# Patient Record
Sex: Female | Born: 1975 | Hispanic: Yes | Marital: Married | Smoking: Never smoker
Health system: Southern US, Community
[De-identification: ages and names within clinical notes are randomized; demographics above are authoritative.]

## PROBLEM LIST (undated history)

## (undated) DIAGNOSIS — K219 Gastro-esophageal reflux disease without esophagitis: Secondary | ICD-10-CM

## (undated) HISTORY — DX: Gastro-esophageal reflux disease without esophagitis: K21.9

## (undated) HISTORY — PX: OTHER SURGICAL HISTORY: SHX169

---

## 2008-10-08 ENCOUNTER — Emergency Department (HOSPITAL_COMMUNITY): Admission: EM | Admit: 2008-10-08 | Discharge: 2008-10-08 | Payer: Self-pay | Admitting: Emergency Medicine

## 2010-05-18 ENCOUNTER — Ambulatory Visit (HOSPITAL_COMMUNITY)
Admission: RE | Admit: 2010-05-18 | Discharge: 2010-05-18 | Payer: Self-pay | Source: Home / Self Care | Attending: Obstetrics & Gynecology | Admitting: Obstetrics & Gynecology

## 2010-08-14 LAB — RAPID STREP SCREEN (MED CTR MEBANE ONLY): Streptococcus, Group A Screen (Direct): NEGATIVE

## 2010-09-04 ENCOUNTER — Inpatient Hospital Stay (HOSPITAL_COMMUNITY)
Admission: AD | Admit: 2010-09-04 | Discharge: 2010-09-04 | Disposition: A | Discharge status: Home / Self Care | Payer: Medicaid Other | Source: Ambulatory Visit | Attending: Obstetrics & Gynecology | Admitting: Obstetrics & Gynecology

## 2010-09-04 DIAGNOSIS — O479 False labor, unspecified: Secondary | ICD-10-CM

## 2010-09-06 ENCOUNTER — Inpatient Hospital Stay (HOSPITAL_COMMUNITY)
Admission: AD | Admit: 2010-09-06 | Discharge: 2010-09-08 | DRG: 775 | Disposition: A | Discharge status: Home / Self Care | Payer: Medicaid Other | Source: Ambulatory Visit | Attending: Family Medicine | Admitting: Family Medicine

## 2010-09-06 DIAGNOSIS — O34219 Maternal care for unspecified type scar from previous cesarean delivery: Secondary | ICD-10-CM

## 2010-09-06 LAB — RPR: RPR Ser Ql: NONREACTIVE

## 2010-09-06 LAB — CBC
MCV: 85 fL (ref 78.0–100.0)
Platelets: 169 10*3/uL (ref 150–400)
RBC: 4.72 MIL/uL (ref 3.87–5.11)
RDW: 14.1 % (ref 11.5–15.5)
WBC: 10.1 10*3/uL (ref 4.0–10.5)

## 2010-09-16 ENCOUNTER — Inpatient Hospital Stay (HOSPITAL_COMMUNITY): Admission: AD | Admit: 2010-09-16 | Payer: Self-pay | Source: Home / Self Care | Admitting: Obstetrics & Gynecology

## 2013-05-07 NOTE — L&D Delivery Note (Signed)
Delivery Note 38 y.o. [id number] 3314w5d, VBAC  At 8:03 AM a viable female was delivered via VBAC, Spontaneous (Presentation: Left Occiput Anterior), prior to delivery decel to 50-80s x 2+ minutes.  APGAR: 8, 9; weight .   Placenta status: Intact, Spontaneous.  Cord: 3 vessels with the following complications: None. Cord blood drawn  Anesthesia: Epidural  Episiotomy: None Lacerations: None Suture Repair: n/a Est. Blood Loss (mL): 400  Mom to postpartum.  Baby to Couplet care / Skin to Skin.  Xaria Judon ROCIO 11/30/2013, 8:56 AM

## 2013-06-22 ENCOUNTER — Other Ambulatory Visit (HOSPITAL_COMMUNITY): Payer: Self-pay | Admitting: Nurse Practitioner

## 2013-06-22 DIAGNOSIS — Z3689 Encounter for other specified antenatal screening: Secondary | ICD-10-CM

## 2013-06-22 LAB — OB RESULTS CONSOLE HIV ANTIBODY (ROUTINE TESTING): HIV: NONREACTIVE

## 2013-06-22 LAB — OB RESULTS CONSOLE ABO/RH: RH TYPE: POSITIVE

## 2013-06-22 LAB — OB RESULTS CONSOLE GC/CHLAMYDIA
CHLAMYDIA, DNA PROBE: NEGATIVE
Gonorrhea: NEGATIVE

## 2013-06-22 LAB — OB RESULTS CONSOLE RPR: RPR: NONREACTIVE

## 2013-06-22 LAB — OB RESULTS CONSOLE ANTIBODY SCREEN: Antibody Screen: NEGATIVE

## 2013-06-22 LAB — OB RESULTS CONSOLE HEPATITIS B SURFACE ANTIGEN: HEP B S AG: NEGATIVE

## 2013-06-22 LAB — OB RESULTS CONSOLE RUBELLA ANTIBODY, IGM: Rubella: IMMUNE

## 2013-07-21 ENCOUNTER — Ambulatory Visit (HOSPITAL_COMMUNITY)
Admission: RE | Admit: 2013-07-21 | Discharge: 2013-07-21 | Disposition: A | Discharge status: Home / Self Care | Payer: Medicaid Other | Source: Ambulatory Visit | Attending: Nurse Practitioner | Admitting: Nurse Practitioner

## 2013-07-21 DIAGNOSIS — O09529 Supervision of elderly multigravida, unspecified trimester: Secondary | ICD-10-CM | POA: Insufficient documentation

## 2013-07-21 DIAGNOSIS — Z3689 Encounter for other specified antenatal screening: Secondary | ICD-10-CM | POA: Insufficient documentation

## 2013-07-21 NOTE — Progress Notes (Signed)
MFM ultrasound  Indication: 38 yr old G3P2002 at 6754w6d for fetal anatomic survey. Remote read.  Findings: 1. Single intrauterine pregnancy. 2. Fetal biometry is consistent with dating. 3. Posterior placenta without evidence of previa. 4. Normal amniotic fluid volume. 5. Normal transabdominal cervical length. 6. The views of the heart are limited. 7. The remainder of the limited anatomy survey is normal.  Recommendations: 1. Appropriate fetal growth. 2. Limited anatomy survey: - recommend follow up in 3-4 weeks to complete anatomic survey 3. Advanced maternal age: - recommend offer genetic counseling and aneuploidy screening  Eulis FosterKristen Griffen Frayne, MD

## 2013-07-22 ENCOUNTER — Other Ambulatory Visit (HOSPITAL_COMMUNITY): Payer: Self-pay | Admitting: Nurse Practitioner

## 2013-07-22 DIAGNOSIS — Z0489 Encounter for examination and observation for other specified reasons: Secondary | ICD-10-CM

## 2013-07-22 DIAGNOSIS — IMO0002 Reserved for concepts with insufficient information to code with codable children: Secondary | ICD-10-CM

## 2013-08-25 ENCOUNTER — Ambulatory Visit (HOSPITAL_COMMUNITY)
Admission: RE | Admit: 2013-08-25 | Discharge: 2013-08-25 | Disposition: A | Discharge status: Home / Self Care | Payer: Medicaid Other | Source: Ambulatory Visit | Attending: Nurse Practitioner | Admitting: Nurse Practitioner

## 2013-08-25 DIAGNOSIS — IMO0002 Reserved for concepts with insufficient information to code with codable children: Secondary | ICD-10-CM

## 2013-08-25 DIAGNOSIS — Z1389 Encounter for screening for other disorder: Secondary | ICD-10-CM | POA: Insufficient documentation

## 2013-08-25 DIAGNOSIS — Z0489 Encounter for examination and observation for other specified reasons: Secondary | ICD-10-CM

## 2013-08-25 DIAGNOSIS — Z363 Encounter for antenatal screening for malformations: Secondary | ICD-10-CM | POA: Insufficient documentation

## 2013-10-12 ENCOUNTER — Other Ambulatory Visit (HOSPITAL_COMMUNITY): Payer: Self-pay | Admitting: Physician Assistant

## 2013-10-12 DIAGNOSIS — O365990 Maternal care for other known or suspected poor fetal growth, unspecified trimester, not applicable or unspecified: Secondary | ICD-10-CM

## 2013-10-13 ENCOUNTER — Ambulatory Visit (HOSPITAL_COMMUNITY)
Admission: RE | Admit: 2013-10-13 | Discharge: 2013-10-13 | Disposition: A | Discharge status: Home / Self Care | Payer: Medicaid Other | Source: Ambulatory Visit | Attending: Physician Assistant | Admitting: Physician Assistant

## 2013-10-13 ENCOUNTER — Encounter (HOSPITAL_COMMUNITY): Payer: Self-pay

## 2013-10-13 DIAGNOSIS — O365990 Maternal care for other known or suspected poor fetal growth, unspecified trimester, not applicable or unspecified: Secondary | ICD-10-CM

## 2013-10-13 DIAGNOSIS — Z3689 Encounter for other specified antenatal screening: Secondary | ICD-10-CM | POA: Insufficient documentation

## 2013-11-09 ENCOUNTER — Other Ambulatory Visit (HOSPITAL_COMMUNITY): Payer: Self-pay | Admitting: Nurse Practitioner

## 2013-11-09 DIAGNOSIS — R6250 Unspecified lack of expected normal physiological development in childhood: Secondary | ICD-10-CM

## 2013-11-09 LAB — OB RESULTS CONSOLE GBS: GBS: NEGATIVE

## 2013-11-13 ENCOUNTER — Ambulatory Visit (HOSPITAL_COMMUNITY)
Admission: RE | Admit: 2013-11-13 | Discharge: 2013-11-13 | Disposition: A | Discharge status: Home / Self Care | Payer: Medicaid Other | Source: Ambulatory Visit | Attending: Nurse Practitioner | Admitting: Nurse Practitioner

## 2013-11-13 DIAGNOSIS — O365990 Maternal care for other known or suspected poor fetal growth, unspecified trimester, not applicable or unspecified: Secondary | ICD-10-CM | POA: Insufficient documentation

## 2013-11-13 DIAGNOSIS — Z3689 Encounter for other specified antenatal screening: Secondary | ICD-10-CM | POA: Insufficient documentation

## 2013-11-13 DIAGNOSIS — R6250 Unspecified lack of expected normal physiological development in childhood: Secondary | ICD-10-CM

## 2013-11-20 ENCOUNTER — Emergency Department (INDEPENDENT_AMBULATORY_CARE_PROVIDER_SITE_OTHER)
Admission: EM | Admit: 2013-11-20 | Discharge: 2013-11-20 | Disposition: A | Discharge status: Home / Self Care | Payer: Medicaid Other | Source: Home / Self Care | Attending: Family Medicine | Admitting: Family Medicine

## 2013-11-20 ENCOUNTER — Encounter (HOSPITAL_COMMUNITY): Payer: Self-pay | Admitting: Emergency Medicine

## 2013-11-20 DIAGNOSIS — J020 Streptococcal pharyngitis: Secondary | ICD-10-CM

## 2013-11-20 LAB — POCT RAPID STREP A: Streptococcus, Group A Screen (Direct): POSITIVE — AB

## 2013-11-20 MED ORDER — AMOXICILLIN 500 MG PO CAPS: 500.0000 mg | ORAL_CAPSULE | Freq: Two times a day (BID) | ORAL | Status: DC

## 2013-11-20 NOTE — ED Provider Notes (Signed)
Samantha Gardner is a 38 y.o. female who presents to Urgent Care today for sore throat. Patient is a one-day history of sore throat and bilateral ear pain associated with chills. No fevers nausea vomiting or diarrhea or shortness of breath. Patient has not tried any medications yet. She currently is approximately [redacted] weeks pregnant. Her pregnancy is going well.   History reviewed. No pertinent past medical history. History  Substance Use Topics  . Smoking status: Never Smoker   . Smokeless tobacco: Not on file  . Alcohol Use: No   ROS as above Medications: No current facility-administered medications for this encounter.   Current Outpatient Prescriptions  Medication Sig Dispense Refill  . amoxicillin (AMOXIL) 500 MG capsule Take 1 capsule (500 mg total) by mouth 2 (two) times daily. spanish  20 capsule  0    Exam:  BP 106/64  Pulse 90  Temp(Src) 99.7 F (37.6 C) (Oral)  Resp 20  SpO2 100%  LMP 02/25/2013 Gen: Well NAD HEENT: EOMI,  MMM posterior pharynx with erythema. Tympanic membranes are normal appearing bilaterally. Lungs: Normal work of breathing. CTABL Heart: RRR no MRG Abd: NABS, Soft. Nontender, gravid Exts: Brisk capillary refill, warm and well perfused.   Results for orders placed during the hospital encounter of 11/20/13 (from the past 24 hour(s))  POCT RAPID STREP A ([facility]RG CARE ONLY)     Status: Abnormal   Collection Time    11/20/13  6:58 PM      Result Value Ref Range   Streptococcus, Group A Screen (Direct) POSITIVE (*) NEGATIVE   No results found.  Assessment and Plan: 38 y.o. female with strep throat. Treatment with amoxicillin and Tylenol. Followup as needed.  Discussed warning signs or symptoms. Please see discharge instructions. Patient expresses understanding.   This note was created using Conservation officer, historic buildingsDragon voice recognition software. Any transcription errors are unintended.    Rodolph BongEvan S Pati Thinnes, MD 11/20/13 [phone]

## 2013-11-20 NOTE — Discharge Instructions (Signed)
Gracias por venir hoy. Amoxicillin 500mg  twice dialy.  Tylenol 325mg  2 pills every 6 hours for pain and fever.   Angina por estreptococos (Strep Throat)  La angina estreptocccica es una infeccin en la garganta causada por una bacteria llamada Streptococcus pyogenes. El mdico la llamar "amigdalitis" o "faringitis" estreptocccica, segn haya signos de inflamacin en las amgdalas o en la zona posterior de la garganta. Es ms frecuente en nios entre los 5 y los 15 aos durante los meses de fro, Biomedical engineerpero puede ocurrir a Dealerpersonas de Actuarycualquier edad. La infeccin se transmite de persona a persona (contagio) a travs de la tos, el estornudo u otro contacto cercano.  SNTOMAS  Fiebre o escalofros.  La garganta o las Goodyear Tireamgdalas le duelen y estn inflamadas.  Dolor o dificultad para tragar.  Puntos blancos o amarillos en las amgdalas o la garganta.  Ganglios linfticos hinchados a los lados del cuello o debajo de la [place]bula.  Erupcin rojiza en todo el cuerpo (poco comn). DIAGNSTICO Diferentes infecciones pueden causar los mismos sntomas. Para confirmar el diagnstico deber Assuranthacerse anlisis, y as podrn prescribirle el tratamiento adecuado. La "prueba rpida de estreptococo" ayudar al mdico a hacer el diagnstico en algunos minutos. Si no se dispone de la prueba, se har un rpido hisopado de la zona afectada para hacer un cultivo de las secreciones de la garganta. Si se hace un cultivo, los resultados estarn disponibles en Henry Scheinuno o dos das.  TRATAMIENTO El estreptococo de garganta se trata con antibiticos. INSTRUCCIONES PARA EL CUIDADO DOMICILIARIO  Haga grgaras con 1 cucharadita de sal en 1 taza de agua tibia, 3  4 veces por da o cuando lo crea necesario para sentirse mejor.  Los miembros de la familia que tambin tengan dolor en la garganta deben ser evaluados y tratados con antibiticos si tienen la infeccin.  Asegrese de que todas las personas de su casa se laven Progress Energybien las  manos.  No comparta alimentos, tazas ni utensilios personales que puedan contagiar la infeccin.  Coma alimentos blandos hasta que el dolor de garganta mejore.  Beba gran cantidad de lquido para mantener la orina de tono claro o color amarillo plido. Esto ayudar a Agricultural engineerprevenir la deshidratacin.  Debe hacer reposo.  No concurra a la escuela o la trabajo hasta que haya tomado los antibiticos durante 24 horas.  Utilice los medicamentos de venta libre o de prescripcin para Chief Technology Officerel dolor, Environmental health practitionerel malestar o la [place]dalefiebre, segn se lo indique el profesional que lo asiste.  Si le han recetado medicamentos, tmelos segn las indicaciones. Finalice la prescripcin completa, aunque se sienta mejor. SOLICITE ATENCIN MDICA SI:  Los ganglios del cuello siguen agrandados.  Aparece una erupcin cutnea, tos o dolor de odos.  Tiene un catarro verde, amarillo amarronado o esputo sanguinolento.  Siente dolor o Dentistmalestar que no puede controlar con los medicamentos.  Los sntomas parecen empeorar en vez de mejorar. SOLICITE ATENCIN MDICA DE INMEDIATO SI:  Presenta algn nuevo sntoma, como vmitos, dolor de cabeza intenso, rigidez o dolor en el cuello, dolor en el pecho o problemas respiratorios o dificultad para tragar.  Presenta dolor de garganta intenso, babeo o cambios en la voz.  Siente que el cuello se hincha o la piel de esa zona se vuelve roja y sensible.  Tiene fiebre.  Tiene signos de deshidratacin, como fatiga, boca seca y disminucin de la [place].  Comienza a sentir mucho sueo, o no puede despertarse bien. Document Released: 01/31/2005 Document Revised: 04/09/2012 [facility] Patient Information 2015 [employer], [place].  This information is not intended to replace advice given to you by your health care provider. Make sure you discuss any questions you have with your health care provider. ° °

## 2013-11-20 NOTE — ED Notes (Signed)
C/o sore throat and bilateral ear pain since last night.  Denies fever, n/v/d.

## 2013-11-30 ENCOUNTER — Encounter (HOSPITAL_COMMUNITY): Payer: Self-pay | Admitting: *Deleted

## 2013-11-30 ENCOUNTER — Encounter (HOSPITAL_COMMUNITY): Payer: Medicaid Other | Admitting: Anesthesiology

## 2013-11-30 ENCOUNTER — Inpatient Hospital Stay (HOSPITAL_COMMUNITY): Payer: Medicaid Other | Admitting: Anesthesiology

## 2013-11-30 ENCOUNTER — Inpatient Hospital Stay (HOSPITAL_COMMUNITY)
Admission: AD | Admit: 2013-11-30 | Discharge: 2013-12-01 | DRG: 775 | Disposition: A | Discharge status: Home / Self Care | Payer: Medicaid Other | Source: Ambulatory Visit | Attending: Obstetrics & Gynecology | Admitting: Obstetrics & Gynecology

## 2013-11-30 DIAGNOSIS — O4290 Premature rupture of membranes, unspecified as to length of time between rupture and onset of labor, unspecified weeks of gestation: Principal | ICD-10-CM | POA: Diagnosis present

## 2013-11-30 DIAGNOSIS — O34219 Maternal care for unspecified type scar from previous cesarean delivery: Secondary | ICD-10-CM | POA: Diagnosis present

## 2013-11-30 DIAGNOSIS — O480 Post-term pregnancy: Secondary | ICD-10-CM

## 2013-11-30 DIAGNOSIS — O09529 Supervision of elderly multigravida, unspecified trimester: Secondary | ICD-10-CM | POA: Diagnosis present

## 2013-11-30 DIAGNOSIS — IMO0001 Reserved for inherently not codable concepts without codable children: Secondary | ICD-10-CM

## 2013-11-30 LAB — CBC
HCT: 36.3 % (ref 36.0–46.0)
HEMOGLOBIN: 12.2 g/dL (ref 12.0–15.0)
MCH: 28 pg (ref 26.0–34.0)
MCHC: 33.6 g/dL (ref 30.0–36.0)
MCV: 83.3 fL (ref 78.0–100.0)
PLATELETS: 194 10*3/uL (ref 150–400)
RBC: 4.36 MIL/uL (ref 3.87–5.11)
RDW: 14.1 % (ref 11.5–15.5)
WBC: 9.8 10*3/uL (ref 4.0–10.5)

## 2013-11-30 LAB — TYPE AND SCREEN
ABO/RH(D): A POS
Antibody Screen: NEGATIVE

## 2013-11-30 LAB — POCT FERN TEST: POCT FERN TEST: POSITIVE

## 2013-11-30 LAB — ABO/RH: ABO/RH(D): A POS

## 2013-11-30 LAB — RPR

## 2013-11-30 MED ORDER — LACTATED RINGERS IV SOLN: 500.0000 mL | INTRAVENOUS | Status: DC | PRN

## 2013-11-30 MED ORDER — FENTANYL 2.5 MCG/ML BUPIVACAINE 1/10 % EPIDURAL INFUSION (WH - ANES)
14.0000 mL/h | INTRAMUSCULAR | Status: DC | PRN
  Administered 2013-11-30: 14 mL/h via EPIDURAL
  Filled 2013-11-30: qty 125

## 2013-11-30 MED ORDER — CITRIC ACID-SODIUM CITRATE 334-500 MG/5ML PO SOLN: 30.0000 mL | ORAL | Status: DC | PRN

## 2013-11-30 MED ORDER — SIMETHICONE 80 MG PO CHEW
80.0000 mg | CHEWABLE_TABLET | ORAL | Status: DC | PRN
Start: 2013-11-30 — End: 2013-12-01

## 2013-11-30 MED ORDER — OXYCODONE-ACETAMINOPHEN 5-325 MG PO TABS: 1.0000 | ORAL_TABLET | ORAL | Status: DC | PRN

## 2013-11-30 MED ORDER — IBUPROFEN 600 MG PO TABS
600.0000 mg | ORAL_TABLET | Freq: Four times a day (QID) | ORAL | Status: DC | PRN
  Administered 2013-11-30: 600 mg via ORAL
  Filled 2013-11-30: qty 1

## 2013-11-30 MED ORDER — LIDOCAINE HCL (PF) 1 % IJ SOLN
30.0000 mL | INTRAMUSCULAR | Status: DC | PRN
  Filled 2013-11-30: qty 30

## 2013-11-30 MED ORDER — ZOLPIDEM TARTRATE 5 MG PO TABS: 5.0000 mg | ORAL_TABLET | Freq: Every evening | ORAL | Status: DC | PRN

## 2013-11-30 MED ORDER — DIPHENHYDRAMINE HCL 25 MG PO CAPS: 25.0000 mg | ORAL_CAPSULE | Freq: Four times a day (QID) | ORAL | Status: DC | PRN

## 2013-11-30 MED ORDER — IBUPROFEN 600 MG PO TABS
600.0000 mg | ORAL_TABLET | Freq: Four times a day (QID) | ORAL | Status: DC
  Administered 2013-11-30 – 2013-12-01 (×5): 600 mg via ORAL
  Filled 2013-11-30 (×5): qty 1

## 2013-11-30 MED ORDER — DIBUCAINE 1 % RE OINT: 1.0000 "application " | TOPICAL_OINTMENT | RECTAL | Status: DC | PRN

## 2013-11-30 MED ORDER — ONDANSETRON HCL 4 MG/2ML IJ SOLN: 4.0000 mg | Freq: Four times a day (QID) | INTRAMUSCULAR | Status: DC | PRN

## 2013-11-30 MED ORDER — TETANUS-DIPHTH-ACELL PERTUSSIS 5-2.5-18.5 LF-MCG/0.5 IM SUSP: 0.5000 mL | Freq: Once | INTRAMUSCULAR | Status: DC

## 2013-11-30 MED ORDER — BENZOCAINE-MENTHOL 20-0.5 % EX AERO
1.0000 | INHALATION_SPRAY | CUTANEOUS | Status: DC | PRN
Start: 2013-11-30 — End: 2013-12-01

## 2013-11-30 MED ORDER — OXYCODONE-ACETAMINOPHEN 5-325 MG PO TABS
1.0000 | ORAL_TABLET | ORAL | Status: DC | PRN
  Administered 2013-12-01: 1 via ORAL
  Filled 2013-11-30: qty 1

## 2013-11-30 MED ORDER — OXYTOCIN 40 UNITS IN LACTATED RINGERS INFUSION - SIMPLE MED
62.5000 mL/h | INTRAVENOUS | Status: DC
  Filled 2013-11-30: qty 1000

## 2013-11-30 MED ORDER — PHENYLEPHRINE 40 MCG/ML (10ML) SYRINGE FOR IV PUSH (FOR BLOOD PRESSURE SUPPORT)
80.0000 ug | PREFILLED_SYRINGE | INTRAVENOUS | Status: DC | PRN
  Filled 2013-11-30: qty 2
  Filled 2013-11-30: qty 10

## 2013-11-30 MED ORDER — ACETAMINOPHEN 325 MG PO TABS: 650.0000 mg | ORAL_TABLET | ORAL | Status: DC | PRN

## 2013-11-30 MED ORDER — LANOLIN HYDROUS EX OINT: TOPICAL_OINTMENT | CUTANEOUS | Status: DC | PRN

## 2013-11-30 MED ORDER — OXYTOCIN BOLUS FROM INFUSION
500.0000 mL | INTRAVENOUS | Status: DC
  Administered 2013-11-30: 500 mL via INTRAVENOUS

## 2013-11-30 MED ORDER — LACTATED RINGERS IV SOLN
500.0000 mL | Freq: Once | INTRAVENOUS | Status: AC
  Administered 2013-11-30: 500 mL via INTRAVENOUS

## 2013-11-30 MED ORDER — WITCH HAZEL-GLYCERIN EX PADS
1.0000 | MEDICATED_PAD | CUTANEOUS | Status: DC | PRN
Start: 2013-11-30 — End: 2013-12-01

## 2013-11-30 MED ORDER — LIDOCAINE HCL (PF) 1 % IJ SOLN
INTRAMUSCULAR | Status: DC | PRN
Start: 2013-11-30 — End: 2013-11-30
  Administered 2013-11-30: 5 mL
  Administered 2013-11-30: 3 mL
  Administered 2013-11-30: 5 mL

## 2013-11-30 MED ORDER — FENTANYL 2.5 MCG/ML BUPIVACAINE 1/10 % EPIDURAL INFUSION (WH - ANES)
INTRAMUSCULAR | Status: DC | PRN
  Administered 2013-11-30: 14 mL/h via EPIDURAL

## 2013-11-30 MED ORDER — EPHEDRINE 5 MG/ML INJ
10.0000 mg | INTRAVENOUS | Status: DC | PRN
  Filled 2013-11-30: qty 2

## 2013-11-30 MED ORDER — FENTANYL CITRATE 0.05 MG/ML IJ SOLN: 100.0000 ug | INTRAMUSCULAR | Status: DC | PRN

## 2013-11-30 MED ORDER — DIPHENHYDRAMINE HCL 50 MG/ML IJ SOLN: 12.5000 mg | INTRAMUSCULAR | Status: DC | PRN

## 2013-11-30 MED ORDER — SENNOSIDES-DOCUSATE SODIUM 8.6-50 MG PO TABS
2.0000 | ORAL_TABLET | ORAL | Status: DC
  Administered 2013-11-30: 2 via ORAL
  Filled 2013-11-30: qty 2

## 2013-11-30 MED ORDER — PHENYLEPHRINE 40 MCG/ML (10ML) SYRINGE FOR IV PUSH (FOR BLOOD PRESSURE SUPPORT)
80.0000 ug | PREFILLED_SYRINGE | INTRAVENOUS | Status: DC | PRN
  Filled 2013-11-30: qty 2

## 2013-11-30 MED ORDER — PRENATAL MULTIVITAMIN CH
1.0000 | ORAL_TABLET | Freq: Every day | ORAL | Status: DC
  Administered 2013-11-30 – 2013-12-01 (×2): 1 via ORAL
  Filled 2013-11-30 (×2): qty 1

## 2013-11-30 MED ORDER — LACTATED RINGERS IV SOLN
INTRAVENOUS | Status: DC
  Administered 2013-11-30: 03:00:00 via INTRAVENOUS

## 2013-11-30 MED ORDER — ONDANSETRON HCL 4 MG PO TABS: 4.0000 mg | ORAL_TABLET | ORAL | Status: DC | PRN

## 2013-11-30 MED ORDER — ONDANSETRON HCL 4 MG/2ML IJ SOLN: 4.0000 mg | INTRAMUSCULAR | Status: DC | PRN

## 2013-11-30 NOTE — H&P (Signed)
Samantha Gardner is a 38 y.o. female G3P2001 with IUP at [redacted]w[redacted]d presenting for PROM. Pt states she has been having regular, every 5 minutes contractions, associated with scant staining vaginal bleeding.  Membranes are ruptured, clear fluid, with active fetal movement.   PNCare at [facility] since 20 wks  Prenatal History/Complications: Pt. Presents after rising to go to the bathroom tonight at 0200 and had ROM. She says fluid was clear to coffee colored. She had a small amount of bleeding that has since tapered to spotting. She has considerable discomfort with her contractions at this time. She has +FM. She denies complications with this pregnancy. She has previously had one C-section in [place] at [facility] for unknown indication. She has had one other delivery here at [facility] at 40W which was a successful VBAC. She denies previous complications with either pregnancy. Her Last U/S at [redacted]W[redacted]D showed EFW 6lb. 4oz. She denies any past medical history or surgery other than previous C-section. She has no other complaints at this time.    Past Medical History: History reviewed. No pertinent past medical history.  Past Surgical History: Past Surgical History  Procedure Laterality Date  . Cesarean section      Obstetrical History: OB History   Grav Para Term Preterm Abortions TAB SAB Ect Mult Living   3 2 2       1       Gynecological History: OB History   Grav Para Term Preterm Abortions TAB SAB Ect Mult Living   3 2 2       1       Social History: History   Social History  . Marital Status: Married    Spouse Name: N/A    Number of Children: N/A  . Years of Education: N/A   Social History Main Topics  . Smoking status: Never Smoker   . Smokeless tobacco: None  . Alcohol Use: No  . Drug Use: No  . Sexual Activity: Yes   Other Topics Concern  . None   Social History Narrative  . None    Family History: History reviewed. No pertinent family history.  Allergies: No Known  Allergies  Prescriptions prior to admission  Medication Sig Dispense Refill  . amoxicillin (AMOXIL) 500 MG capsule Take 1 capsule (500 mg total) by mouth 2 (two) times daily. spanish  20 capsule  0     Review of Systems   Per HPI above.   Blood pressure 125/71, pulse 71, temperature 98.4 F (36.9 C), temperature source Oral, resp. rate 18, height 5' 3.5" (1.613 m), weight 64.411 kg (142 lb), last menstrual period 02/25/2013, SpO2 100.00%. General appearance: alert, cooperative and no distress Lungs: clear to auscultation bilaterally Heart: regular rate and rhythm Abdomen: soft, non-tender; bowel sounds normal Pelvic: 5cm / 90% / 0 Extremities: Homans sign is negative, no sign of DVT Grossly neurologically intact Presentation: cephalic Fetal monitoringBaseline: 125 bpm, Variability: Good {> 6 bpm) and Accelerations: Reactive Uterine activityFrequency: Every 3 minutes Dilation: 5 Effacement (%): 90 Station: 0 Exam by:: Gaye Alken RN   Prenatal labs: ABO, Rh: A/Positive/-- (02/16 0000) Antibody: Negative (02/16 0000) Rubella:  Immune RPR: Nonreactive (02/16 0000)  HBsAg: Negative (02/16 0000)  HIV: Non-reactive (02/16 0000)  GBS: Negative (07/06 0000)  1 hr Glucola 77 Genetic screening  Normal Anatomy US Normal   Prenatal Transfer Tool  Maternal Diabetes: No Genetic Screening: Normal Maternal Ultrasounds/Referrals: Normal Fetal Ultrasounds or other Referrals:  None Maternal Substance Abuse:  No Significant Maternal Medications:  None Significant Maternal Lab Results: Lab values include: Group B Strep negative     Results for orders placed during the hospital encounter of 11/30/13 (from the past 24 hour(s))  CBC   Collection Time    11/30/13  3:15 AM      Result Value Ref Range   WBC 9.8  4.0 - 10.5 K/uL   RBC 4.36  3.87 - 5.11 MIL/uL   Hemoglobin 12.2  12.0 - 15.0 g/dL   HCT 16.136.3  09.636.0 - 04.546.0 %   MCV 83.3  78.0 - 100.0 fL   MCH 28.0  26.0 - 34.0 pg    MCHC 33.6  30.0 - 36.0 g/dL   RDW 40.914.1  81.111.5 - 91.415.5 %   Platelets 194  150 - 400 K/uL  POCT FERN TEST   Collection Time    11/30/13  3:36 AM      Result Value Ref Range   POCT Fern Test Positive = ruptured amniotic membanes      Assessment: Samantha BeauVictoria Gardner is a 38 y.o. G3P2001 at 463w5d by L=20 W u/s here for PROM.  #Labor: In active labor. Progressing well. Will recheck in 2 hours for progress. Expectant management at this time.  #Pain: Epidural  #FWB: Cat I. EFW 6lb4oz per 36W u/s.  #ID:  GBS Neg. No ppx at this time.  #MOF: Breast #MOC: Would like to discuss later #Circ:   N/A  Melancon, Caleb G 11/30/2013, 4:02 AM   I examined pt and agree with documentation above and resident plan of care. Eino FarberWalidah Kennith GainN Karim, CNM

## 2013-11-30 NOTE — MAU Note (Signed)
Pt reports leaking fluid since 2 am, contractions.

## 2013-11-30 NOTE — Anesthesia Postprocedure Evaluation (Signed)
Anesthesia Post Note  Patient: Samantha Gardner  Procedure(s) Performed: * No procedures listed *  Anesthesia type: Epidural  Patient location: Mother/Baby  Post pain: Pain level controlled  Post assessment: Post-op Vital signs reviewed  Last Vitals:  Filed Vitals:   11/30/13 1054  BP: 99/55  Pulse: 65  Temp: 36.8 C  Resp: 18    Post vital signs: Reviewed  Level of consciousness:alert  Complications: No apparent anesthesia complications

## 2013-11-30 NOTE — Anesthesia Preprocedure Evaluation (Signed)

## 2013-11-30 NOTE — Anesthesia Procedure Notes (Signed)
Epidural Patient location during procedure: OB  Staffing Anesthesiologist: Carsen Machi Performed by: anesthesiologist   Preanesthetic Checklist Completed: patient identified, site marked, surgical consent, pre-op evaluation, timeout performed, IV checked, risks and benefits discussed and monitors and equipment checked  Epidural Patient position: sitting Prep: ChloraPrep Patient monitoring: heart rate, continuous pulse ox and blood pressure Approach: right paramedian Location: L2-L3 Injection technique: LOR saline  Needle:  Needle type: Tuohy  Needle gauge: 17 G Needle length: 9 cm and 9 Needle insertion depth: 5 cm Catheter type: closed end flexible Catheter size: 20 Guage Catheter at skin depth: 9 cm Test dose: negative  Assessment Events: blood not aspirated, injection not painful, no injection resistance, negative IV test and no paresthesia  Additional Notes   Patient tolerated the insertion well without complications.   

## 2013-11-30 NOTE — Lactation Note (Signed)
This note was copied from the chart of Samantha Gardner. Lactation Consultation Note  Patient Name: Samantha Gardner Today's Date: 11/30/2013 Reason for consult: Initial assessment of this mother/baby dyad at 11 hours postpartum.  Mom [facility] has baby lat[facility].  Mom speaks Spanish and LC able to communicate through her husband as interpreter, as well as some Spanish spoken by [facility].  Mom's sister and her older children are present at bedside.  Mom states that she breastfed her 323 and 436 yo children for 6 months each but developed sore nipples with her 463 yo daughter, per FOB who is Nurse, learning disabilitytranslator. Mom had planned to both breast and formula/bottle-feed and states she did this with others.  LC explained that early introduction of formula/bottles can lead to BF problems based on LEAD cautions and LC encouraged exclusive breastfeeding for at least 2 weeks.  LC demonstrated hand expression and drops are expressed easily.  Mom removes baby from breast after 10 minutes and nipples are everted and slightly pink on tip. Baby had just finished breastfeeding on (R) breast in cradle hold with widely flanged lips and rhythmical sucks observed by LC.  LC provided comfort gelpads and reviewed nipple care guidelines.  LC also encouraged ad lib cue feedings and frequent STS and cautioned mom not to pull breast tissue away from baby while she is latched as this can lead to shallow latch and nipple soreness.  LC encouraged review of Baby and Me pp 13-16 for review of BF information in [place].LC provided Pacific MutualLC Resource brochure in Spanish, and reviewed WH services and list of community and web site resources, especially LLLI website which has information available in [place]..   Maternal Data Formula Feeding for Exclusion: Yes Reason for exclusion: Mother's choice to formula and breast feed on admission Infant to breast within first hour of birth: Yes (initial LATCH score=9 and baby breastfed for 20  minutes) Has patient been taught Hand Expression?: Yes (LC corrected mom's technique which was squeezing her nipple base; with proper compression, drops easily expressed) Does the patient have breastfeeding experience prior to this delivery?: Yes  Feeding Feeding Type: Breast Fed Length of feed: 10 min (right breast)  LATCH Score/Interventions Latch: Grasps breast easily, tongue down, lips flanged, rhythmical sucking.  Audible Swallowing: A few with stimulation  Type of Nipple: Everted at rest and after stimulation  Comfort (Breast/Nipple): Filling, red/small blisters or bruises, mild/mod discomfort  Problem noted: Mild/Moderate discomfort Interventions (Mild/moderate discomfort): Comfort gels  Hold (Positioning): Assistance needed to correctly position infant at breast and maintain latch. Intervention(s): Breastfeeding basics reviewed;Position options;Skin to skin  LATCH Score: 7 ([facility] by [facility] after baby already latched)  Lactation Tools Discussed/Used Tools: Comfort gels STS, cue feedings, hand expression LEAD cautions  Consult Status Consult Status: Follow-up Date: 12/01/13 Follow-up type: In-patient    Warrick ParisianBryant, Abimael Zeiter [facility] Centerarmly 11/30/2013, 7:54 PM

## 2013-12-01 ENCOUNTER — Encounter (HOSPITAL_COMMUNITY): Payer: Self-pay | Admitting: Advanced Practice Midwife

## 2013-12-01 LAB — CBC
HCT: 29.9 % — ABNORMAL LOW (ref 36.0–46.0)
Hemoglobin: 9.9 g/dL — ABNORMAL LOW (ref 12.0–15.0)
MCH: 27.9 pg (ref 26.0–34.0)
MCHC: 33.1 g/dL (ref 30.0–36.0)
MCV: 84.2 fL (ref 78.0–100.0)
PLATELETS: 154 10*3/uL (ref 150–400)
RBC: 3.55 MIL/uL — ABNORMAL LOW (ref 3.87–5.11)
RDW: 14.3 % (ref 11.5–15.5)
WBC: 9.7 10*3/uL (ref 4.0–10.5)

## 2013-12-01 MED ORDER — IBUPROFEN 200 MG PO TABS: 600.0000 mg | ORAL_TABLET | Freq: Four times a day (QID) | ORAL | Status: DC | PRN

## 2013-12-01 NOTE — Discharge Summary (Signed)
Attestation of Attending Supervision of Advanced Practitioner (CNM/NP): Evaluation and management procedures were performed by the Advanced Practitioner under my supervision and collaboration. I have reviewed the Advanced Practitioner's note and chart, and I agree with the management and plan.  Koni Kannan H. 10:45 AM   

## 2013-12-01 NOTE — Progress Notes (Signed)
Mother reports no pain at this time. Plan of care discussed with mother and FOB. FOB speaks and understands english, mother responds to questions appropriately. Donn PieriniJuana, spanish CNA, at the bedside and helped facilitate newborn weight and asked mother if she had any questions for me. Patient denied any questions or concerns. Offered spanish interpreter to mother if she feels necessary. Patient reported infant feeding times to me in english.

## 2013-12-01 NOTE — Discharge Summary (Signed)
Obstetric Discharge Summary Reason for Admission: onset of labor Prenatal Procedures: none Intrapartum Procedures: spontaneous vaginal delivery Postpartum Procedures: none Complications-Operative and Postpartum: none  Delivery Note At 8:03 AM a viable female was delivered via VBAC, Spontaneous (Presentation: Left Occiput Anterior).  APGAR: 8, 9; weight 6 lb 6.7 oz (2910 g).   Placenta status: Intact, Spontaneous.  Cord: 3 vessels with the following complications: None.  Cord pH: not obtained  Anesthesia: Epidural  Episiotomy: None Lacerations: None Suture Repair: n/a Est. Blood Loss (mL): 400  Mom to postpartum.  Baby to Couplet care / Skin to Skin.  Samantha Gardner, Kamoni Depree C 12/01/2013, 9:23 AM     Hospital Course:  Active Problems:   Active labor   Today: No acute events overnight.  Pt denies problems with ambulating, voiding or po intake.  She denies nausea or vomiting.  Pain is well controlled.  She has had flatus. She has not had bowel movement.  Lochia Small.  Plan for birth control is  condoms, vasectomy.  Method of Feeding: breast  Samantha Gardner is a 38 y.o. [id number] s/p nsvd.  Patient presented to OBT contractions and was admitted to L&D.  She has postoperative course that was uncomplicated including no problems with ambulating, PO intake, urination, pain, or bleeding. The pt feels ready to go home and  will be discharged with outpatient follow-up.    H/H: Lab Results  Component Value Date/Time   HGB 9.9* 12/01/2013  6:40 AM   HCT 29.9* 12/01/2013  6:40 AM    Discharge Diagnoses: Term Pregnancy-delivered  Discharge Information: Date: .9:25 AM on 7/28 Activity: pelvic rest Diet: routine  Medications: Ibuprofen Breast feeding:  Yes Condition: stable Instructions: refer to handout Discharge to: home   Discharge Instructions   Activity as tolerated    Complete by:  As directed      Call MD for:  difficulty breathing, headache or visual disturbances     Complete by:  As directed      Call MD for:  extreme fatigue    Complete by:  As directed      Call MD for:  persistant dizziness or light-headedness    Complete by:  As directed      Call MD for:  persistant nausea and vomiting    Complete by:  As directed      Call MD for:  severe uncontrolled pain    Complete by:  As directed      Call MD for:  temperature >100.4    Complete by:  As directed      Diet - low sodium heart healthy    Complete by:  As directed      Discharge instructions    Complete by:  As directed      No wound care    Complete by:  As directed             Medication List    STOP taking these medications       amoxicillin 500 MG capsule  Commonly known as:  AMOXIL      TAKE these medications       ibuprofen 200 MG tablet  Commonly known as:  MOTRIN IB  Take 3 tablets (600 mg total) by mouth every 6 (six) hours as needed.     prenatal multivitamin Tabs tablet  Take 1 tablet by mouth daily at 12 noon.         Samantha Gardner, Nickol Collister C ,MD OB Fellow 12/01/2013,9:23 AM

## 2013-12-14 ENCOUNTER — Emergency Department (HOSPITAL_COMMUNITY)
Admission: EM | Admit: 2013-12-14 | Discharge: 2013-12-14 | Disposition: A | Discharge status: Home / Self Care | Payer: Medicaid Other | Source: Home / Self Care | Attending: Family Medicine | Admitting: Family Medicine

## 2013-12-14 ENCOUNTER — Encounter (HOSPITAL_COMMUNITY): Payer: Self-pay | Admitting: Emergency Medicine

## 2013-12-14 DIAGNOSIS — J029 Acute pharyngitis, unspecified: Secondary | ICD-10-CM | POA: Diagnosis not present

## 2013-12-14 LAB — POCT RAPID STREP A: Streptococcus, Group A Screen (Direct): NEGATIVE

## 2013-12-14 MED ORDER — ANTIPYRINE-BENZOCAINE 5.4-1.4 % OT SOLN: 3.0000 [drp] | OTIC | Status: DC | PRN

## 2013-12-14 MED ORDER — IBUPROFEN 600 MG PO TABS: 600.0000 mg | ORAL_TABLET | Freq: Four times a day (QID) | ORAL | Status: DC | PRN

## 2013-12-14 NOTE — ED Notes (Signed)
Pt c/o sore throat onset 2 days and bilateral ear pain Reports she was seen here on 7/17; given amox Did not finish antibiotic due to preg Denies f/v/n/d, cold sx Alert w/no signs of acute distress.

## 2013-12-14 NOTE — ED Provider Notes (Signed)
CSN: [id number]     Arrival date & time 12/14/13  1129 History   First MD Initiated Contact with Patient 12/14/13 1244     Chief Complaint  Patient presents with  . Sore Throat   (Consider location/radiation/quality/duration/timing/severity/associated sxs/prior Treatment) HPI  Husband acted as interpretor   Sore throat: started 2 days ago. Associated w/ earaches. Denies CP, SOB, Fevers, runny nose, cough. Getting worse. Has not taken any medications. Tolerating PO. Dry sensation in throat. Tolerating PO.    History reviewed. No pertinent past medical history. Past Surgical History  Procedure Laterality Date  . Cesarean section     No family history on file. History  Substance Use Topics  . Smoking status: Never Smoker   . Smokeless tobacco: Not on file  . Alcohol Use: No   OB History   Grav Para Term Preterm Abortions TAB SAB Ect Mult Living   3 3 3       3      Review of Systems Per HPI with all other pertinent systems negative.   Allergies  Review of patient's allergies indicates no known allergies.  Home Medications   Prior to Admission medications   Medication Sig Start Date End Date Taking? Authorizing Provider  antipyrine-benzocaine Lyla Son) otic solution Place 3-4 drops into both ears every 2 (two) hours as needed for ear pain. 12/14/13   Ozella Rocks, MD  ibuprofen (MOTRIN IB) 600 MG tablet Take 1 tablet (600 mg total) by mouth every 6 (six) hours as needed. 12/14/13   Ozella Rocks, MD  Prenatal Vit-Fe Fumarate-FA (PRENATAL MULTIVITAMIN) TABS tablet Take 1 tablet by mouth daily at 12 noon.    Historical Provider, MD   BP 107/65  Pulse 87  Temp(Src) 98.8 F (37.1 C) (Oral)  SpO2 100%  Breastfeeding? Yes Physical Exam  Constitutional: She is oriented to person, place, and time. She appears well-developed and well-nourished. No distress.  HENT:  Head: Normocephalic and atraumatic.  Small amount of clear fluid behind L TM w/o purulence or injection R  TM severely scarred but no evidence of effusion. R external canal w/ erythema.   Eyes: EOM are normal. Pupils are equal, round, and reactive to light. Right eye exhibits no discharge. Left eye exhibits no discharge.  Neck: Normal range of motion. Neck supple. No JVD present. No thyromegaly present.  Cardiovascular: Normal rate, normal heart sounds and intact distal pulses.   No murmur heard. Pulmonary/Chest: Effort normal and breath sounds normal. No respiratory distress.  Abdominal: Soft. She exhibits no distension.  Musculoskeletal: Normal range of motion. She exhibits no edema and no tenderness.  Lymphadenopathy:    She has no cervical adenopathy.  Neurological: She is alert and oriented to person, place, and time.  Skin: Skin is warm. She is not diaphoretic.  Psychiatric: She has a normal mood and affect. Her behavior is normal. Judgment and thought content normal.    ED Course  Procedures (including critical care time) Labs Review Labs Reviewed  CULTURE, GROUP A STREP  POCT RAPID STREP A ([facility] CARE ONLY)    Imaging Review No results found.   MDM   1. Sore throat    Viral pharyngitis.: Rapid strep neg. Amox 4 wks ago for strep throat.  Pt is 2 wks postaprtum Ibuprofen 600 Strep culture sent Auralgan PRN ear discomfort - mild possible early otitis externa on R Precautions given and all questions answered  Shelly Flatten, MD Family Medicine 12/14/2013, 1:27 PM    Elmon Else  Konrad DoloresMerrell, MD 12/14/13 1327

## 2013-12-14 NOTE — Discharge Instructions (Signed)
You are doing well overall Your strep test was negative Your sore throat is likely from a viral illness and will clear in another 1-3 days Please take motrin 600 every 6 hours as needed for pain  Please use the auralgan for ear discomfort.  We will call you if your lab work comes back positive and requires further treatment These medications are safe to take while breast feeding   Usted est haciendo bien en general Su prueba estreptoccica fue negativo El dolor de garganta es probable de una enfermedad viral y Museum/gallery conservatordespejar en otro 1-3 das Por favor, tome motrin 600 cada 6 horas segn sea necesario para el dolor Utilice el Auralgan para Environmental health practitionerel malestar del odo. Lo llamaremos si su trabajo de laboratorio resulta positiva y requiere tratamiento adicional Estos medicamentos son seguros para tomar durante la lactancia  Faringitis (Pharyngitis) La faringitis ocurre cuando la faringe presenta enrojecimiento, dolor e hinchazn (inflamacin).  CAUSAS  Normalmente, la faringitis se debe a una infeccin. Generalmente, estas infecciones ocurren debido a virus (viral) y se presentan cuando las personas se resfran. Sin embargo, a Advertising account executiveveces la faringitis es provocada por bacterias (bacteriana). Las alergias tambin pueden ser una causa de la faringitis. La faringitis viral se puede contagiar de Neomia Dearuna persona a otra al toser, estornudar y compartir objetos o utensilios personales (tazas, tenedores, cucharas, cepillos de diente). La faringitis bacteriana se puede contagiar de Neomia Dearuna persona a otra a travs de un contacto ms ntimo, como besar.  SIGNOS Y SNTOMAS  Los sntomas de la faringitis incluyen los siguientes:   Dolor de Advertising copywritergarganta.  Cansancio (fatiga).  Fiebre no muy elevada.  Dolor de Turkmenistancabeza.  Dolores musculares y en las articulaciones.  Erupciones cutneas  Ganglios linfticos hinchados.  Una pelcula parecida a las placas en la garganta o las amgdalas (frecuente con la faringitis  bacteriana). DIAGNSTICO  El mdico le har preguntas sobre la enfermedad y sus sntomas. Normalmente, todo lo que se necesita para diagnosticar una faringitis son sus antecedentes mdicos y un examen fsico. A veces se realiza una prueba rpida para estreptococos. Tambin es posible que se realicen otros anlisis de laboratorio, segn la posible causa.  TRATAMIENTO  La faringitis viral normalmente mejorar en un plazo de 3 a 4das sin medicamentos. La faringitis bacteriana se trata con medicamentos que McGraw-Hillmatan los grmenes (antibiticos).  INSTRUCCIONES PARA EL CUIDADO EN EL HOGAR   Beba gran cantidad de lquido para mantener la orina de tono claro o color amarillo plido.  Tome solo medicamentos de venta libre o recetados, segn las indicaciones del mdico.  Si le receta antibiticos, asegrese de terminarlos, incluso si comienza a Actorsentirse mejor.  No tome aspirina.  Descanse lo suficiente.  Hgase grgaras con 8onzas (227ml) de agua con sal (cucharadita de sal por litro de agua) cada 1 o 2horas para Science writercalmar la garganta.  Puede usar pastillas (si no corre riesgo de Health visitorahogarse) o aerosoles para Science writercalmar la garganta. SOLICITE ATENCIN MDICA SI:   Tiene bultos grandes y dolorosos en el cuello.  Tiene una erupcin cutnea.  Cuando tose elimina una expectoracin verde, amarillo amarronado o con Abbevillesangre. SOLICITE ATENCIN MDICA DE INMEDIATO SI:   El cuello se pone rgido.  Comienza a babear o no puede tragar lquidos.  Vomita o no puede retener los American International Groupmedicamentos ni los lquidos.  Siente un dolor intenso que no se alivia con los medicamentos recomendados.  Tiene dificultades para respirar (y no debido a la nariz tapada). ASEGRESE DE QUE:   Comprende estas instrucciones.  Controlar  su afeccin.  Recibir ayuda de inmediato si no mejora o si empeora. Document Released: 01/31/2005 Document Revised: 02/11/2013 [facility] Patient Information 2015 [place], [place]. This information  is not intended to replace advice given to you by your health care provider. Make sure you discuss any questions you have with your health care provider.

## 2013-12-16 LAB — CULTURE, GROUP A STREP

## 2014-03-08 ENCOUNTER — Encounter (HOSPITAL_COMMUNITY): Payer: Self-pay | Admitting: Emergency Medicine

## 2016-09-13 ENCOUNTER — Other Ambulatory Visit (HOSPITAL_COMMUNITY)
Admission: RE | Admit: 2016-09-13 | Discharge: 2016-09-13 | Disposition: A | Discharge status: Home / Self Care | Payer: BLUE CROSS/BLUE SHIELD | Source: Ambulatory Visit | Attending: Family Medicine | Admitting: Family Medicine

## 2016-09-13 ENCOUNTER — Other Ambulatory Visit: Payer: Self-pay | Admitting: Family Medicine

## 2016-09-13 DIAGNOSIS — Z01419 Encounter for gynecological examination (general) (routine) without abnormal findings: Secondary | ICD-10-CM | POA: Insufficient documentation

## 2016-09-17 LAB — CYTOLOGY - PAP: DIAGNOSIS: NEGATIVE

## 2018-08-05 ENCOUNTER — Other Ambulatory Visit: Payer: Self-pay

## 2018-08-06 ENCOUNTER — Ambulatory Visit: Payer: BLUE CROSS/BLUE SHIELD | Admitting: Obstetrics & Gynecology

## 2019-07-30 ENCOUNTER — Other Ambulatory Visit: Payer: Self-pay

## 2019-07-30 ENCOUNTER — Encounter: Payer: Self-pay | Admitting: Internal Medicine

## 2019-07-30 ENCOUNTER — Ambulatory Visit (INDEPENDENT_AMBULATORY_CARE_PROVIDER_SITE_OTHER): Payer: 59 | Admitting: Internal Medicine

## 2019-07-30 VITALS — BP 110/68 | HR 75 | Temp 97.8°F | Ht 63.0 in | Wt 151.0 lb

## 2019-07-30 DIAGNOSIS — H66001 Acute suppurative otitis media without spontaneous rupture of ear drum, right ear: Secondary | ICD-10-CM

## 2019-07-30 DIAGNOSIS — K219 Gastro-esophageal reflux disease without esophagitis: Secondary | ICD-10-CM | POA: Diagnosis not present

## 2019-07-30 MED ORDER — AMOXICILLIN-POT CLAVULANATE 875-125 MG PO TABS: 1.0000 | ORAL_TABLET | Freq: Two times a day (BID) | ORAL | 0 refills | Status: AC

## 2019-07-30 MED ORDER — PANTOPRAZOLE SODIUM 40 MG PO TBEC: 40.0000 mg | DELAYED_RELEASE_TABLET | Freq: Every day | ORAL | 1 refills | Status: DC

## 2019-07-30 NOTE — Patient Instructions (Addendum)
-  Guste en conocerle.  -Tomese el protonix de 40 mg en las maanas y espere 30 minutos antes de comer.  -Tomese augmentin 1 pastilla 2 veces al dia por 7 dias para la infeccion de oidos.  -Regrese en 3 meses.   Otitis media en los adultos Otitis Media, Adult  Otitis media significa que el odo medio est rojo e hinchado (inflamado) y lleno de lquido. Generalmente, la afeccin desaparece sin tratamiento. Siga estas indicaciones en su casa:  Tome los medicamentos de venta libre y los recetados solamente como se lo haya indicado el mdico.  Si le recetaron un antibitico, tmelo como se lo haya indicado el mdico. No deje de tomar el antibitico aunque comience a sentirse mejor.  Concurra a todas las visitas de control como se lo haya indicado el mdico. Esto es importante. Comunquese con un mdico si:  Le sangra la nariz.  Tiene un bulto en el cuello.  No mejora luego de 5das.  Empeora en lugar de mejorar. Solicite ayuda de inmediato si:  Siente dolor que no se [employer].  Tiene hinchazn, enrojecimiento o dolor en el odo.  Tiene rigidez en el cuello.  No puede mover una parte de la cara (parlisis).  Nota que el hueso que se encuentra detrs de la oreja le duele al tocarlo.  Siente un dolor de cabeza muy intenso. Resumen  Otitis media significa que el odo medio est rojo, hinchado y lleno de lquido.  Generalmente, esta afeccin desaparece sin tratamiento. En algunos casos, puede no ser [employer].  Si le recetaron un antibitico, tmelo como se lo haya indicado el mdico. Esta informacin no tiene Theme park manager el consejo del mdico. Asegrese de hacerle al mdico cualquier pregunta que tenga. Document Revised: 01/31/2017 Document Reviewed: 11/18/2012 Elsevier Patient Education  2020 [employer].

## 2019-07-30 NOTE — Progress Notes (Signed)
New Patient Office Visit     This visit occurred during the SARS-CoV-2 public health emergency.  Safety protocols were in place, including screening questions prior to the visit, additional usage of staff PPE, and extensive cleaning of exam room while observing appropriate contact time as indicated for disinfecting solutions.    CC/Reason for Visit: Establish care, discuss some acute concerns Previous PCP: None Last Visit: unknown  HPI: Samantha Gardner is a 44 y.o. female who is coming in today for the above mentioned reasons. Past Medical History is significant for: GERD.  She states that while in [place] 2 years ago she had an endoscopy and was diagnosed "with inflammation in her stomach".  She is Spanish-speaking only.  She has not been taking PPI therapy.  She has been having significant epigastric pain, throat soreness and nausea.  Her symptoms are exacerbated by foods containing tomatoes or very spicy foods.  She is also complaining of some right ear pain for the past week, has not noticed any discharge, no headache, no fever, no runny nose.  She does not smoke, she does not drink, she uses NSAIDs infrequently.   Past Medical/Surgical History: Past Medical History:  Diagnosis Date  . GERD (gastroesophageal reflux disease)     Past Surgical History:  Procedure Laterality Date  . CESAREAN SECTION      Social History:  reports that she has never smoked. She has never used smokeless tobacco. She reports that she does not drink alcohol or use drugs.  Allergies: No Known Allergies  Family History:  History reviewed. No pertinent family history.   Current Outpatient Medications:  .  ferrous sulfate 325 (65 FE) MG EC tablet, Take 1 tablet by mouth in the morning and at bedtime., Disp: , Rfl:  .  amoxicillin-clavulanate (AUGMENTIN) 875-125 MG tablet, Take 1 tablet by mouth 2 (two) times daily for 7 days., Disp: 14 tablet, Rfl: 0 .  pantoprazole (PROTONIX) 40 MG tablet,  Take 1 tablet (40 mg total) by mouth daily., Disp: 90 tablet, Rfl: 1  Review of Systems:  Constitutional: Denies fever, chills, diaphoresis, appetite change and fatigue.  HEENT: Denies photophobia, eye pain, redness, hearing loss,  congestion, sore throat, rhinorrhea, sneezing, mouth sores, trouble swallowing, neck pain, neck stiffness and tinnitus.   Respiratory: Denies SOB, DOE, cough, chest tightness,  and wheezing.   Cardiovascular: Denies chest pain, palpitations and leg swelling.  Gastrointestinal: Denies nausea, vomiting, abdominal pain, diarrhea, constipation, blood in stool and abdominal distention.  Genitourinary: Denies dysuria, urgency, frequency, hematuria, flank pain and difficulty urinating.  Endocrine: Denies: hot or cold intolerance, sweats, changes in hair or nails, polyuria, polydipsia. Musculoskeletal: Denies myalgias, back pain, joint swelling, arthralgias and gait problem.  Skin: Denies pallor, rash and wound.  Neurological: Denies dizziness, seizures, syncope, weakness, light-headedness, numbness and headaches.  Hematological: Denies adenopathy. Easy bruising, personal or family bleeding history  Psychiatric/Behavioral: Denies suicidal ideation, mood changes, confusion, nervousness, sleep disturbance and agitation    Physical Exam: Vitals:   07/30/19 1306  BP: 110/68  Pulse: 75  Temp: 97.8 F (36.6 C)  TempSrc: Temporal  SpO2: 97%  Weight: 151 lb (68.5 kg)  Height: 5\' 3"  (1.6 m)   Body mass index is 26.75 kg/m.  Constitutional: NAD, calm, comfortable Eyes: PERRL, lids and conjunctivae normal ENMT: Mucous membranes are moist. Tympanic membrane is pearly white, no erythema or bulging on the left, right is erythematous with bulging and air-fluid level. Neck: normal, supple, no masses, no thyromegaly Respiratory: clear  to auscultation bilaterally, no wheezing, no crackles. Normal respiratory effort. No accessory muscle use.  Cardiovascular: Regular rate and  rhythm, no murmurs / rubs / gallops. No extremity edema. Neurologic: Grossly intact and nonfocal  Psychiatric: Normal judgment and insight. Alert and oriented x 3. Normal mood.    Impression and Plan:  Gastroesophageal reflux disease without esophagitis -Sounds like she possibly had gastritis during her previous EGD in [place]. -She is quite symptomatic, I will put her back on Protonix 40 mg daily. -If no improvement in symptoms in 12 weeks, consider GI referral for repeat EGD. -Have asked her to avoid spicy foods, tomatoes, citrus, caffeine and any other foods that she notices might exacerbate her symptoms. -She has been instructed to avoid over-the-counter NSAID use.  She does not smoke or drink alcohol.  Non-recurrent acute suppurative otitis media of right ear without spontaneous rupture of tympanic membrane -Start Augmentin for 7 days. -Return to clinic if no improvement.    Patient Instructions  -Guste en conocerle.  -Tomese el protonix de 40 mg en las maanas y espere 30 minutos antes de comer.  -Tomese augmentin 1 pastilla 2 veces al dia por 7 dias para la infeccion de oidos.  -Regrese en 3 meses.   Otitis media en los adultos Otitis Media, Adult  Otitis media significa que el odo medio est rojo e hinchado (inflamado) y lleno de lquido. Generalmente, la afeccin desaparece sin tratamiento. Siga estas indicaciones en su casa:  Tome los medicamentos de venta libre y los recetados solamente como se lo haya indicado el mdico.  Si le recetaron un antibitico, tmelo como se lo haya indicado el mdico. No deje de tomar el antibitico aunque comience a sentirse mejor.  Concurra a todas las visitas de control como se lo haya indicado el mdico. Esto es importante. Comunquese con un mdico si:  Le sangra la nariz.  Tiene un bulto en el cuello.  No mejora luego de 5das.  Empeora en lugar de mejorar. Solicite ayuda de inmediato si:  Siente dolor que no se [employer].  Tiene hinchazn, enrojecimiento o dolor en el odo.  Tiene rigidez en el cuello.  No puede mover una parte de la cara (parlisis).  Nota que el hueso que se encuentra detrs de la oreja le duele al tocarlo.  Siente un dolor de cabeza muy intenso. Resumen  Otitis media significa que el odo medio est rojo, hinchado y lleno de lquido.  Generalmente, esta afeccin desaparece sin tratamiento. En algunos casos, puede no ser [employer].  Si le recetaron un antibitico, tmelo como se lo haya indicado el mdico. Esta informacin no tiene Theme park manager el consejo del mdico. Asegrese de hacerle al mdico cualquier pregunta que tenga. Document Revised: 01/31/2017 Document Reviewed: 11/18/2012 Elsevier Patient Education  2020 Elsevier Inc.      Chaya Jan, MD [facility] at [facility]

## 2019-10-01 ENCOUNTER — Ambulatory Visit: Payer: BLUE CROSS/BLUE SHIELD | Admitting: Internal Medicine

## 2019-10-13 ENCOUNTER — Other Ambulatory Visit: Payer: Self-pay

## 2019-10-14 ENCOUNTER — Ambulatory Visit (INDEPENDENT_AMBULATORY_CARE_PROVIDER_SITE_OTHER): Payer: 59 | Admitting: Internal Medicine

## 2019-10-14 VITALS — BP 106/70 | HR 76 | Temp 98.3°F | Ht 63.0 in | Wt 149.0 lb

## 2019-10-14 DIAGNOSIS — R202 Paresthesia of skin: Secondary | ICD-10-CM

## 2019-10-14 DIAGNOSIS — R29898 Other symptoms and signs involving the musculoskeletal system: Secondary | ICD-10-CM

## 2019-10-14 DIAGNOSIS — K219 Gastro-esophageal reflux disease without esophagitis: Secondary | ICD-10-CM | POA: Diagnosis not present

## 2019-10-14 DIAGNOSIS — M542 Cervicalgia: Secondary | ICD-10-CM

## 2019-10-14 DIAGNOSIS — R200 Anesthesia of skin: Secondary | ICD-10-CM | POA: Diagnosis not present

## 2019-10-14 MED ORDER — PANTOPRAZOLE SODIUM 40 MG PO TBEC: 40.0000 mg | DELAYED_RELEASE_TABLET | Freq: Every day | ORAL | 1 refills | Status: DC
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
## 2019-10-14 NOTE — Patient Instructions (Signed)
-  Nice seeing you today!!  -C-spine MRI has been ordered today. Will call you once results are available to discuss further steps.

## 2019-10-15 ENCOUNTER — Encounter (HOSPITAL_COMMUNITY): Payer: Self-pay

## 2019-10-15 ENCOUNTER — Emergency Department (HOSPITAL_COMMUNITY)
Admission: EM | Admit: 2019-10-15 | Discharge: 2019-10-15 | Disposition: A | Discharge status: Home / Self Care | Payer: 59 | Attending: Emergency Medicine | Admitting: Emergency Medicine

## 2019-10-15 ENCOUNTER — Other Ambulatory Visit: Payer: Self-pay

## 2019-10-15 DIAGNOSIS — M79601 Pain in right arm: Secondary | ICD-10-CM | POA: Diagnosis not present

## 2019-10-15 MED ORDER — CYCLOBENZAPRINE HCL 10 MG PO TABS: 10.0000 mg | ORAL_TABLET | Freq: Two times a day (BID) | ORAL | 0 refills | Status: DC | PRN

## 2019-10-15 MED ORDER — IBUPROFEN 600 MG PO TABS: 600.0000 mg | ORAL_TABLET | Freq: Four times a day (QID) | ORAL | 0 refills | Status: DC | PRN

## 2019-10-15 NOTE — Discharge Instructions (Signed)
Please take medication prescribed.  Follow-up with your primary care provider for further management of your condition.  Use resources below as needed.

## 2019-10-15 NOTE — ED Triage Notes (Addendum)
Patient complains of right anterior cp radiating to her posterior shoulder and numbness to right arm. Patient reports that this pain has been ongoing for months, pain worse with any ROM. Describes as stabbing with movement. States started 9 months ago

## 2019-10-15 NOTE — ED Provider Notes (Signed)
[facility] EMERGENCY DEPARTMENT Provider Note   CSN: [id number] Arrival date & time: 10/15/19  1118     History No chief complaint on file.   [place] Norma Fredrickson Fenton Malling is a 44 y.o. female.  The history is provided by the patient. The history is limited by a language barrier. A language interpreter was used.     44 year old female with history of GERD presenting for evaluation of right arm and right shoulder pain.  Patient is Spanish-speaking, history obtained through language interpreter.  Patient states September of last year she was exposed to someone with COVID-19.  She did not develop any cold symptoms from the exposure however he she started noticing pain to her right arm and right side of back since.  She described pain as a sharp achy sensation, more noticeable at nighttime, worse with movement, and has been recurrent.  Pain mildly improved with taking hot bath or with taking Tylenol.  She does not report of any associated fever chills productive cough chest pain shortness of breath or rash.  She denies any significant neck pain.  She denies any injuries.  She recently acquired insurance and decided to come to ER for evaluation.  She does not have a primary care provider.  She is right-hand dominant.  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  Denies alcohol or tobacco use.  Not have any significant cardiac history.  She does cooking cleaning and make 3 meals daily.   Past Medical History:  Diagnosis Date  . GERD (gastroesophageal reflux disease)     Patient Active Problem List   Diagnosis Date Noted  . GERD (gastroesophageal reflux disease)   . Active labor 11/30/2013    Past Surgical History:  Procedure Laterality Date  . CESAREAN SECTION       OB History    Gravida  3   Para  3   Term  3   Preterm      AB      Living  3     SAB      TAB      Ectopic      Multiple      Live Births  2           No family history on file.  Social  History   Tobacco Use  . Smoking status: Never Smoker  . Smokeless tobacco: Never Used  Substance Use Topics  . Alcohol use: No  . Drug use: No    Home Medications Prior to Admission medications   Medication Sig Start Date End Date Taking? Authorizing Provider  ferrous sulfate 325 (65 FE) MG EC tablet Take 1 tablet by mouth in the morning and at bedtime. 02/24/19   [provider]  pantoprazole (PROTONIX) 40 MG tablet Take 1 tablet (40 mg total) by mouth daily. 10/14/19   Philip Aspen, Limmie Patricia, MD    Allergies    Patient has no known allergies.  Review of Systems   Review of Systems  Constitutional: Negative for fever.  Respiratory: Negative for chest tightness and shortness of breath.   Musculoskeletal: Positive for arthralgias. Negative for neck pain.  Skin: Negative for rash and wound.  All other systems reviewed and are negative.   Physical Exam Updated Vital Signs BP 119/76 (BP Location: Left Arm)   Pulse 74   Temp 98.2 F (36.8 C) (Oral)   Resp 14   SpO2 100%   Physical Exam Vitals and nursing note reviewed.  Constitutional:      General: She is not in acute distress.    Appearance: She is well-developed.  HENT:     Head: Atraumatic.  Eyes:     Conjunctiva/sclera: Conjunctivae normal.  Cardiovascular:     Rate and Rhythm: Normal rate and regular rhythm.     Pulses: Normal pulses.     Heart sounds: Normal heart sounds.  Pulmonary:     Effort: Pulmonary effort is normal.     Breath sounds: Normal breath sounds. No wheezing, rhonchi or rales.  Abdominal:     Palpations: Abdomen is soft.     Tenderness: There is no abdominal tenderness.  Musculoskeletal:        General: Tenderness (Tenderness to right shoulder upper arm lower arm right posterior back on gentle palpation with mildly decreased shoulder raise secondary to pain.  Normal grip strength, radial pulse 2+ no overlying skin changes) present.     Cervical back: Normal range of motion and  neck supple. No rigidity.  Skin:    Capillary Refill: Capillary refill takes less than 2 seconds.     Findings: No rash.  Neurological:     Mental Status: She is alert.  Psychiatric:        Mood and Affect: Mood normal.     ED Results / Procedures / Treatments   Labs (all labs ordered are listed, but only abnormal results are displayed) Labs Reviewed - No data to display  EKG None  Radiology No results found.  Procedures Procedures (including critical care time)  Medications Ordered in ED Medications - No data to display  ED Course  I have reviewed the triage vital signs and the nursing notes.  Pertinent labs & imaging results that were available during my care of the patient were reviewed by me and considered in my medical decision making (see chart for details).    MDM Rules/Calculators/A&P                          BP 119/76 (BP Location: Left Arm)   Pulse 74   Temp 98.2 F (36.8 C) (Oral)   Resp 14   SpO2 100%   Final Clinical Impression(s) / ED Diagnoses Final diagnoses:  Right arm pain    Rx / DC Orders ED Discharge Orders         Ordered    ibuprofen (ADVIL) 600 MG tablet  Every 6 hours PRN     Discontinue  Reprint     10/15/19 1330    cyclobenzaprine (FLEXERIL) 10 MG tablet  2 times daily PRN     Discontinue  Reprint     10/15/19 1330         1:28 PM Patient here with reproducible right arm pain and shoulder pain as well as right-sided back pain for nearly a year without any significant injury.  No red flags.  Report remote exposure to Covid but did not develop any Covid symptoms.  She is up-to-date with her Covid vaccination.  She is here after acquiring insurance and would like to have this pain evaluated.  I have low suspicion for fracture or dislocation I also have low suspicion for underlying infectious etiology.  Doubt stroke.  Suspect more musculoskeletal pain.  Will provide symptomatic treatment but encourage patient to follow-up with  primary care provider for further evaluation.  Doubt ACS.  No acute emergent medical condition identified during this visit.  Vital signs stable.   Rona Ravens,  Kelton Pillar 10/15/19 1331    Gwyneth Sprout, MD 10/16/19 1847

## 2019-10-16 ENCOUNTER — Other Ambulatory Visit: Payer: Self-pay

## 2019-10-19 ENCOUNTER — Ambulatory Visit (INDEPENDENT_AMBULATORY_CARE_PROVIDER_SITE_OTHER): Payer: 59 | Admitting: Family Medicine

## 2019-10-19 ENCOUNTER — Encounter: Payer: Self-pay | Admitting: Family Medicine

## 2019-10-19 ENCOUNTER — Other Ambulatory Visit: Payer: Self-pay

## 2019-10-19 ENCOUNTER — Ambulatory Visit (INDEPENDENT_AMBULATORY_CARE_PROVIDER_SITE_OTHER)
Admission: RE | Admit: 2019-10-19 | Discharge: 2019-10-19 | Disposition: A | Discharge status: Home / Self Care | Payer: 59 | Source: Ambulatory Visit | Attending: Family Medicine | Admitting: Family Medicine

## 2019-10-19 ENCOUNTER — Other Ambulatory Visit: Payer: 59

## 2019-10-19 VITALS — BP 120/66 | HR 79 | Temp 97.3°F | Resp 12 | Ht 63.0 in | Wt 150.4 lb

## 2019-10-19 DIAGNOSIS — M549 Dorsalgia, unspecified: Secondary | ICD-10-CM

## 2019-10-19 DIAGNOSIS — R200 Anesthesia of skin: Secondary | ICD-10-CM | POA: Diagnosis not present

## 2019-10-19 MED ORDER — PREDNISONE 20 MG PO TABS: ORAL_TABLET | ORAL | 0 refills | Status: AC

## 2019-10-19 NOTE — Progress Notes (Signed)
ACUTE VISIT  Chief Complaint  Patient presents with  . Back Pain    mid to lower right-sided back pain that started 1 week ago   HPI: Samantha Gardner is a 44 y.o. female, who is here today with above complaint. 3-4 months of intermittent right-sided back pain, mainly upper back.  She was recently in the ER (10/15/19), Ibuprofen and Flexeril prescribed. Medication has helped but started with pain again 1 week ago. She has also seen PCP for neck pain and cervical MRI is pending.  She denies hx of injury or unusual level of activity.  Pain is sometimes radiated to right upper chest. Pain is sharp like, 10/10 in intensity when she went to the ER, today 4/10. Associated RUE numbness. Negative for bowel/bladder dysfunction or saddle anesthesia.  Exacerbated by movement. Alleviated by rest. No rash or edema on area, fever, chills, or abnormal wt loss.  + Fatigue. Pain is interfering with sleep.  She is concerned about lung disease causing pain. In 01/2019 she was exposed to COVID 19, she did not develop symptoms. Denies cough,wheezing,or SOB.  Review of Systems  Constitutional: Negative for appetite change and diaphoresis.  HENT: Negative for mouth sores and sore throat.   Cardiovascular: Negative for palpitations and leg swelling.  Gastrointestinal: Negative for abdominal pain, nausea and vomiting.  Musculoskeletal: Negative for gait problem and joint swelling.  Skin: Negative for rash.  Neurological: Negative for syncope, weakness and headaches.  Psychiatric/Behavioral: Positive for sleep disturbance. Negative for confusion. The patient is nervous/anxious.   Rest see pertinent positives and negatives per HPI.  Current Outpatient Medications on File Prior to Visit  Medication Sig Dispense Refill  . cyclobenzaprine (FLEXERIL) 10 MG tablet Take 1 tablet (10 mg total) by mouth 2 (two) times daily as needed for muscle spasms. 20 tablet 0  . ferrous sulfate 325 (65  FE) MG EC tablet Take 1 tablet by mouth in the morning and at bedtime.    Marland Kitchen ibuprofen (ADVIL) 600 MG tablet Take 1 tablet (600 mg total) by mouth every 6 (six) hours as needed. 30 tablet 0  . pantoprazole (PROTONIX) 40 MG tablet Take 1 tablet (40 mg total) by mouth daily. 90 tablet 1   No current facility-administered medications on file prior to visit.     Past Medical History:  Diagnosis Date  . GERD (gastroesophageal reflux disease)    No Known Allergies  Social History   Socioeconomic History  . Marital status: Married    Spouse name: Not on file  . Number of children: Not on file  . Years of education: Not on file  . Highest education level: Not on file  Occupational History  . Not on file  Tobacco Use  . Smoking status: Never Smoker  . Smokeless tobacco: Never Used  Substance and Sexual Activity  . Alcohol use: No  . Drug use: No  . Sexual activity: Yes  Other Topics Concern  . Not on file  Social History Narrative  . Not on file   Social Determinants of Health   Financial Resource Strain:   . Difficulty of Paying Living Expenses:   Food Insecurity:   . Worried About Programme researcher, broadcasting/film/video in the Last Year:   . Barista in the Last Year:   Transportation Needs:   . Freight forwarder (Medical):   Marland Kitchen Lack of Transportation (Non-Medical):   Physical Activity:   . Days of Exercise per Week:   .  Minutes of Exercise per Session:   Stress:   . Feeling of Stress :   Social Connections:   . Frequency of Communication with Friends and Family:   . Frequency of Social Gatherings with Friends and Family:   . Attends Religious Services:   . Active Member of Clubs or Organizations:   . Attends Banker Meetings:   Marland Kitchen Marital Status:     Vitals:   10/19/19 1526  BP: 120/66  Pulse: 79  Resp: 12  Temp: (!) 97.3 F (36.3 C)  SpO2: 97%   Body mass index is 26.64 kg/m.  Physical Exam  Nursing note and vitals reviewed. Constitutional: She  is oriented to person, place, and time. She appears well-developed. She does not appear ill. No distress.  HENT:  Head: Normocephalic and atraumatic.  Eyes: Conjunctivae are normal.  Cardiovascular: Normal rate and regular rhythm.  No murmur heard. Respiratory: Effort normal. No respiratory distress. She exhibits no tenderness.  GI: Soft. There is no abdominal tenderness.  Musculoskeletal:     Right shoulder: Normal.     Left shoulder: Normal.     Cervical back: Tenderness present. No edema, erythema, rigidity or bony tenderness.     Thoracic back: Spasms and tenderness present. No bony tenderness.     Lumbar back: No edema, tenderness or bony tenderness.       Back:  Neurological: She is alert and oriented to person, place, and time.  Skin: Skin is warm. No rash noted. No erythema.  Psychiatric: Affect normal. Her mood appears anxious.    ASSESSMENT AND PLAN:  Samantha Gardner was seen today for back pain.  Diagnoses and all orders for this visit:  Orders Placed This Encounter  Procedures  . DG Chest 2 View  . DG Cervical Spine Complete    Upper back pain on right side Getting worse. Tried to reassure in regard to pulmonary etiology. Local ice,massage,and icy hot may help. Continue Ibuprofen and Flexeril. She may benefit from PT. Instructed about warning signs.  Right upper extremity numbness ? Radicular. After discussion of some side effects of Prednisone she agrees with Prednisone taper. Instructed to take med with breakfast and not  To take it with Ibuprofen. Pending cervical MRI.  -     predniSONE (DELTASONE) 20 MG tablet; 3 tabs for 3 days, 2 tabs for 3 days, 1 tabs for 3 days, and 1/2 tab for 3 days. Take tables together with breakfast.   Return in about 2 weeks (around 11/02/2019) for back pain with pcp.   Bonny Egger G. [place], MD  [facility]. [place] office.  Discharge Instructions   None     A few things to remember from today's  visit:   Upper back pain on right side - Plan: DG Chest 2 View, DG Cervical Spine Complete  Right upper extremity numbness  Tome prednisoe con el desayuno y no al mismo tiempo que el ibuprofen. Mansaje, icy hot (sin formula medica) y hielo pueden ayudar. Pida una cita para ver su doctor en 2 semanas.   Dolor radicular Radicular Pain El dolor radicular es un tipo de dolor que se extiende desde la espalda o el cuello a lo largo del nervio raqudeo. Los nervios raqudeos se originan en la mdula espinal y llegan a los msculos. El dolor radicular a veces se conoce como radiculopata, radiculitis o pinzamiento de un nervio. Si tiene este tipo de Engineer, mining, tambin puede sentir debilidad, adormecimiento u hormigueo en la zona del cuerpo a  la que llega ese nervio. El dolor puede ser agudo y sentirse como un ardor. Segn el nervio raqudeo que est afectado, el dolor puede aparecer en los siguientes lugares:  Zona del cuello (dolor radicular cervical). Tambin puede Education officer, environmental, adormecimiento, debilidad u hormigueo [employer].  Zona de la mitad de la columna (dolor radicular torcico). Se siente en la espalda y en el pecho. Este tipo es poco frecuente.  Zona inferior de la espalda (dolor radicular lumbar). Se siente un dolor lumbar. Tambin puede Education officer, environmental, adormecimiento, debilidad u hormigueo en las nalgas o en las piernas. La citica es un tipo de dolor radicular lumbar que se extiende por la parte de atrs de la pierna. El dolor radicular se produce cuando uno de los nervios espinales se irrita o se aprieta (comprime). A menudo se debe a algo que ejerce presin sobre un nervio espinal, como uno de los huesos de la columna vertebral (vrtebras) o una de las almohadillas redondas que estn entre las vrtebras (discos intervertebrales). Esto puede deberse a lo siguiente:  Una lesin.  El desgaste o el envejecimiento de un disco.  El crecimiento de un espoln seo que aprieta el [place]. El  dolor radicular suele desaparecer cuando se siguen las indicaciones del mdico sobre cmo aliviarlo en la casa. Siga estas indicaciones en su casa: Control del dolor      Si se lo indican, aplique hielo sobre la zona afectada: ? Ponga el hielo en una bolsa plstica. ? Coloque una [employer] piel y Therapist, nutritional. ? Coloque el hielo durante 25minutos, 2 a 3veces por da.  Si se lo indican, aplique calor en la zona afectada con la frecuencia que le haya indicado el mdico. Use la fuente de calor que el mdico le recomiende, como una compresa de calor hmedo o una almohadilla trmica. ? Coloque una [employer] piel y la fuente de Freight forwarder. ? Aplique calor durante 20 a 21minutos. ? Retire la fuente de calor si la piel se pone de color rojo brillante. Esto es especialmente importante si no puede sentir dolor, calor o fro. Puede correr un riesgo mayor de sufrir quemaduras. Actividad   No permanezca sentado o acostado durante largos perodos.  Intente mantenerse lo ms activo posible. Pregntele al mdico qu tipo de ejercicio o actividad es mejor para usted.  Evite las actividades que empeoren el dolor, como doblarse y Lexicographer objetos.  No levante ningn objeto que pese ms de 10libras (4,5kg) o el lmite de peso que le hayan indicado, hasta que el mdico le diga que puede [place].  Use una tcnica adecuada al levantar objetos. La tcnica adecuada para levantar objetos consiste en doblar las rodillas y levantarse.  Haga ejercicios de fuerza y flexibilidad solamente como se lo haya indicado el mdico o el fisioterapeuta. Indicaciones generales  [employer] de venta libre y los recetados solamente como se lo haya indicado el mdico.  Est atento a cualquier [employer] sntomas.  Concurra a todas las visitas de seguimiento como se lo haya indicado el mdico. Esto es importante. ? El mdico lo puede enviar a un fisioterapeuta para Microbiologist. Comunquese con un  mdico si:  El dolor y otros sntomas empeoran.  El medicamento no Production designer, theatre/television/film.  El dolor no mejora despus de unas semanas de cuidado Financial planner.  Tiene fiebre. Solicite ayuda inmediatamente si:  Scientist, clinical (histocompatibility and immunogenetics), adormecimiento o debilidad intensos.  Tiene dificultad con el control de la vejiga  o los intestinos. Resumen  El dolor radicular es un tipo de dolor que se extiende desde la espalda o el cuello a lo largo del nervio raqudeo.  Si tiene dolor radicular, tambin puede sentir debilidad, adormecimiento u hormigueo en la zona del cuerpo a la que llega ese nervio.  El dolor puede ser agudo o sentirse como ardor.  El dolor radicular puede tratarse con hielo, calor, medicamentos o fisioterapia. Esta informacin no tiene Theme park manager el consejo del mdico. Asegrese de hacerle al mdico cualquier pregunta que tenga. Document Revised: 12/25/2017 Document Reviewed: 12/25/2017 Elsevier Patient Education  2020 [employer].

## 2019-10-19 NOTE — Patient Instructions (Addendum)
A few things to remember from today's visit:   Upper back pain on right side - Plan: DG Chest 2 View, DG Cervical Spine Complete  Right upper extremity numbness  Tome prednisoe con el desayuno y no al mismo tiempo que el ibuprofen. Mansaje, icy hot (sin formula medica) y hielo pueden ayudar. Pida una cita para ver su doctor en 2 semanas.   Dolor radicular Radicular Pain El dolor radicular es un tipo de dolor que se extiende desde la espalda o el cuello a lo largo del nervio raqudeo. Los nervios raqudeos se originan en la mdula espinal y llegan a los msculos. El dolor radicular a veces se conoce como radiculopata, radiculitis o pinzamiento de un nervio. Si tiene este tipo de Engineer, mining, tambin puede sentir debilidad, adormecimiento u hormigueo en la zona del cuerpo a la que llega ese nervio. El dolor puede ser agudo y sentirse como un ardor. Segn el nervio raqudeo que est afectado, el dolor puede aparecer en los siguientes lugares:  Zona del cuello (dolor radicular cervical). Tambin puede Financial risk analyst, adormecimiento, debilidad u hormigueo [employer].  Zona de la mitad de la columna (dolor radicular torcico). Se siente en la espalda y en el pecho. Este tipo es poco frecuente.  Zona inferior de la espalda (dolor radicular lumbar). Se siente un dolor lumbar. Tambin puede Financial risk analyst, adormecimiento, debilidad u hormigueo en las nalgas o en las piernas. La citica es un tipo de dolor radicular lumbar que se extiende por la parte de atrs de la pierna. El dolor radicular se produce cuando uno de los nervios espinales se irrita o se aprieta (comprime). A menudo se debe a algo que ejerce presin sobre un nervio espinal, como uno de los huesos de la columna vertebral (vrtebras) o una de las almohadillas redondas que estn entre las vrtebras (discos intervertebrales). Esto puede deberse a lo siguiente:  Una lesin.  El desgaste o el envejecimiento de un disco.  El crecimiento de un  espoln seo que aprieta el [place]. El dolor radicular suele desaparecer cuando se siguen las indicaciones del mdico sobre cmo aliviarlo en la casa. Siga estas indicaciones en su casa: Control del dolor      Si se lo indican, aplique hielo sobre la zona afectada: ? Ponga el hielo en una bolsa plstica. ? Coloque una [employer] piel y Copy. ? Coloque el hielo durante , 2 a 3veces por da.  Si se lo indican, aplique calor en la zona afectada con la frecuencia que le haya indicado el mdico. Use la fuente de calor que el mdico le recomiende, como una compresa de calor hmedo o una almohadilla trmica. ? Coloque una [employer] piel y la fuente de Airline pilot. ? Aplique calor durante 20 a . ? Retire la fuente de calor si la piel se pone de color rojo brillante. Esto es especialmente importante si no puede sentir dolor, calor o fro. Puede correr un riesgo mayor de sufrir quemaduras. Actividad   No permanezca sentado o acostado durante largos perodos.  Intente mantenerse lo ms activo posible. Pregntele al mdico qu tipo de ejercicio o actividad es mejor para usted.  Evite las actividades que empeoren el dolor, como doblarse y Lexicographer objetos.  No levante ningn objeto que pese ms de 10libras (4,5kg) o el lmite de peso que le hayan indicado, hasta que el mdico le diga que puede [place].  Use una tcnica adecuada al levantar objetos. La tcnica adecuada para levantar [employer]  en doblar las rodillas y levantarse.  Haga ejercicios de fuerza y flexibilidad solamente como se lo haya indicado el mdico o el fisioterapeuta. Indicaciones generales  [employer] de venta libre y los recetados solamente como se lo haya indicado el mdico.  Est atento a cualquier [employer] sntomas.  Concurra a todas las visitas de seguimiento como se lo haya indicado el mdico. Esto es importante. ? El mdico lo puede enviar a un fisioterapeuta para  Microbiologist. Comunquese con un mdico si:  El dolor y otros sntomas empeoran.  El medicamento no Production designer, theatre/television/film.  El dolor no mejora despus de unas semanas de cuidado Financial planner.  Tiene fiebre. Solicite ayuda inmediatamente si:  Scientist, clinical (histocompatibility and immunogenetics), adormecimiento o debilidad intensos.  Tiene dificultad con el control de la vejiga o los intestinos. Resumen  El dolor radicular es un tipo de dolor que se extiende desde la espalda o el cuello a lo largo del nervio raqudeo.  Si tiene dolor radicular, tambin puede sentir debilidad, adormecimiento u hormigueo en la zona del cuerpo a la que llega ese nervio.  El dolor puede ser agudo o sentirse como ardor.  El dolor radicular puede tratarse con hielo, calor, medicamentos o fisioterapia. Esta informacin no tiene Marine scientist el consejo del mdico. Asegrese de hacerle al mdico cualquier pregunta que tenga. Document Revised: 12/25/2017 Document Reviewed: 12/25/2017 Elsevier Patient Education  2020 [employer].

## 2019-10-30 ENCOUNTER — Ambulatory Visit: Payer: 59 | Admitting: Internal Medicine

## 2019-11-02 ENCOUNTER — Encounter: Payer: Self-pay | Admitting: Family Medicine

## 2019-11-02 ENCOUNTER — Other Ambulatory Visit: Payer: Self-pay

## 2019-11-02 ENCOUNTER — Ambulatory Visit (INDEPENDENT_AMBULATORY_CARE_PROVIDER_SITE_OTHER): Payer: 59 | Admitting: Family Medicine

## 2019-11-02 VITALS — BP 120/60 | HR 82 | Temp 98.3°F | Resp 12 | Ht 63.0 in | Wt 149.0 lb

## 2019-11-02 DIAGNOSIS — M549 Dorsalgia, unspecified: Secondary | ICD-10-CM | POA: Diagnosis not present

## 2019-11-02 NOTE — Patient Instructions (Addendum)
A few things to remember from today's visit:   Upper back pain on right side - Plan: Ambulatory referral to Physical Therapy  Pendiente cita con fisicoterapia.   Please be sure medication list is accurate. If a new problem present, please set up appointment sooner than planned today.

## 2019-11-02 NOTE — Progress Notes (Signed)
HPI: [name] is a 44 y.o. female, who is here today to follow on recent OV.  She was last seen on 10/19/2019, when she was complaining about upper back and neck pain. Course of prednisone was recommended, she completed treatment and tolerated med well. She is not longer taking ibuprofen or Flexeril.  Reporting improvement of pain, "very mild" now. Pain is intermittent and not radiated. RUE numbness has resolved.  Pain is exacerbated by neck and RUE movement without limitation. Negative for fever, chills, night sweats, sore throat, local erythema or edema.  No new concerns today.  Review of Systems  Respiratory: Negative for cough, shortness of breath and wheezing.   Cardiovascular: Negative for chest pain and palpitations.  Skin: Negative for pallor and rash.  Neurological: Negative for weakness and headaches.  Rest see pertinent positives and negatives per HPI.  Current Outpatient Medications on File Prior to Visit  Medication Sig Dispense Refill  . cyclobenzaprine (FLEXERIL) 10 MG tablet Take 1 tablet (10 mg total) by mouth 2 (two) times daily as needed for muscle spasms. 20 tablet 0  . ferrous sulfate 325 (65 FE) MG EC tablet Take 1 tablet by mouth in the morning and at bedtime.    . pantoprazole (PROTONIX) 40 MG tablet Take 1 tablet (40 mg total) by mouth daily. 90 tablet 1   No current facility-administered medications on file prior to visit.     Past Medical History:  Diagnosis Date  . GERD (gastroesophageal reflux disease)    No Known Allergies  Social History   Socioeconomic History  . Marital status: Married    Spouse name: Not on file  . Number of children: Not on file  . Years of education: Not on file  . Highest education level: Not on file  Occupational History  . Not on file  Tobacco Use  . Smoking status: Never Smoker  . Smokeless tobacco: Never Used  Substance and Sexual Activity  . Alcohol use: No  . Drug use: No  . Sexual  activity: Yes  Other Topics Concern  . Not on file  Social History Narrative  . Not on file   Social Determinants of Health   Financial Resource Strain:   . Difficulty of Paying Living Expenses:   Food Insecurity:   . Worried About Programme researcher, broadcasting/film/video in the Last Year:   . Barista in the Last Year:   Transportation Needs:   . Freight forwarder (Medical):   Marland Kitchen Lack of Transportation (Non-Medical):   Physical Activity:   . Days of Exercise per Week:   . Minutes of Exercise per Session:   Stress:   . Feeling of Stress :   Social Connections:   . Frequency of Communication with Friends and Family:   . Frequency of Social Gatherings with Friends and Family:   . Attends Religious Services:   . Active Member of Clubs or Organizations:   . Attends Banker Meetings:   Marland Kitchen Marital Status:    Vitals:   11/02/19 1601  BP: 120/60  Pulse: 82  Resp: 12  Temp: 98.3 F (36.8 C)  SpO2: 96%   Body mass index is 26.39 kg/m.  Physical Exam Vitals and nursing note reviewed.  Constitutional:      General: She is not in acute distress.    Appearance: She is well-developed. She is not ill-appearing.  HENT:     Head: Normocephalic and atraumatic.  Eyes:  Conjunctiva/sclera: Conjunctivae normal.  Pulmonary:     Effort: Pulmonary effort is normal. No respiratory distress.     Breath sounds: Normal breath sounds.  Musculoskeletal:     Right shoulder: No swelling, deformity, tenderness or bony tenderness. Normal range of motion.     Left shoulder: No swelling, deformity, tenderness or bony tenderness. Normal range of motion.     Cervical back: Tenderness present. No edema, deformity, erythema or bony tenderness. Normal range of motion.     Thoracic back: Spasms and tenderness present. No bony tenderness.       Back:  Skin:    General: Skin is warm.     Findings: No erythema or rash.  Neurological:     General: No focal deficit present.     Mental Status:  She is alert and oriented to person, place, and time.     Gait: Gait normal.    ASSESSMENT AND PLAN:  [name] for follow-up.  Diagnoses and all orders for this visit:  Upper back pain on right side -     Ambulatory referral to Physical Therapy  Still having some pain but great improvement. Continue ROM exercises and local massage. PT evaluation will be arranged. Follow-up as needed.  Return if symptoms worsen or fail to improve.  Ustin Cruickshank G. Martin[place], MD  [facility] [place] office.

## 2019-11-03 ENCOUNTER — Ambulatory Visit: Payer: 59 | Admitting: Internal Medicine

## 2019-11-18 ENCOUNTER — Ambulatory Visit: Payer: 59 | Attending: Family Medicine | Admitting: Physical Therapy

## 2019-11-18 ENCOUNTER — Other Ambulatory Visit: Payer: Self-pay

## 2019-11-18 ENCOUNTER — Encounter: Payer: Self-pay | Admitting: Physical Therapy

## 2019-11-18 DIAGNOSIS — R252 Cramp and spasm: Secondary | ICD-10-CM | POA: Insufficient documentation

## 2019-11-18 DIAGNOSIS — M542 Cervicalgia: Secondary | ICD-10-CM | POA: Diagnosis present

## 2019-11-18 DIAGNOSIS — M6281 Muscle weakness (generalized): Secondary | ICD-10-CM | POA: Diagnosis present

## 2019-11-18 DIAGNOSIS — R293 Abnormal posture: Secondary | ICD-10-CM | POA: Diagnosis present

## 2019-11-19 NOTE — Therapy (Signed)
[facility] [address] [place], [place], [address] Phone: [phone]   Fax:  [phone]  Physical Therapy Evaluation  Patient Details  Name: Samantha Gardner MRN: [id number] Date of Birth: 09-30-1975 Referring Provider (PT): Dr. Betty G [place]   Encounter Date: 11/18/2019   PT End of Session - 11/18/19 1629    Visit Number 1    Number of Visits 4    Date for PT Re-Evaluation 12/16/19    Authorization Type [employer] 20% co-pay    PT Start Time 1511   patient arrived 9 minutes late   PT Stop Time 1600    PT Time Calculation (min) 49 min    Activity Tolerance Patient tolerated treatment well    Behavior During Therapy [facility] for tasks assessed/performed           Past Medical History:  Diagnosis Date  . GERD (gastroesophageal reflux disease)     Past Surgical History:  Procedure Laterality Date  . CESAREAN SECTION      There were no vitals filed for this visit.    Subjective Assessment - 11/18/19 1520    Subjective Pt is a 44 y.o. female c/o neck and R shoulder pain. She states there was no MOI. Saw her MD about a month ago when the pain began. She has been taking muscle relaxers for the pain. She wants to get some exercises to help with her tightness and pain.    Pertinent History none    Limitations House hold activities    Diagnostic tests Xray: No acute abnormality noted.    Patient Stated Goals decrease pain; strengthen    Currently in Pain? Yes    Pain Score 2    pain was high when she went to her doctor. states today her pain is mild.   Pain Location Neck    Pain Orientation Right   and shoulder   Pain Descriptors / Indicators Aching;Tightness    Pain Type Acute pain    Pain Radiating Towards down arm and into the hand    Pain Onset More than a month ago    Pain Frequency Intermittent    Aggravating Factors  lighting arms above head, turning head    Pain Relieving Factors taking medications     Effect of Pain on Daily Activities has pain when lifting arms over head, affects her when cooking              [facility] PT Assessment - 11/19/19 0001      Assessment   Medical Diagnosis Upper back and neck pain    Referring Provider (PT) Dr. Betty G [place]    Onset Date/Surgical Date --   about a month ago   Next MD Visit Nothing scheduled     Prior Therapy No      Precautions   Precautions None      Restrictions   Weight Bearing Restrictions No      Balance Screen   Has the patient fallen in the past 6 months No    Has the patient had a decrease in activity level because of a fear of falling?  No    Is the patient reluctant to leave their home because of a fear of falling?  No      Home Environment   Additional Comments nothing significant       Prior Function   Vocation Unemployed      Cognition   Overall Cognitive Status Within Functional  Limits for tasks assessed    Attention Focused    Focused Attention Appears intact    Memory Appears intact    Awareness Appears intact    Problem Solving Appears intact      Observation/Other Assessments   Focus on Therapeutic Outcomes (FOTO)  47% limiation       Sensation   Light Touch Appears Intact    Proprioception Appears Intact    Additional Comments radicualr pain down into her right hand; denies prathesias       Coordination   Gross Motor Movements are Fluid and Coordinated Yes    Fine Motor Movements are Fluid and Coordinated Yes      Posture/Postural Control   Posture Comments shoulders rounded; elevated shoulders       AROM   Cervical Flexion 43    Cervical Extension 16    Cervical - Right Rotation 24    Cervical - Left Rotation 17      Strength   Right Shoulder Flexion 4-/5    Right Elbow Flexion 4+/5    Right Elbow Extension 4+/5    Right Hand Grip (lbs) 22    Left Hand Grip (lbs) 20      Palpation   Palpation comment tightness in upper traps; TTP at subacromial bursa      Special Tests     Special Tests Cervical;Rotator Cuff Impingement    Cervical Tests Spurling's;Dictraction    Rotator Cuff Impingment tests Leanord Asal test;Empty Can test      Spurling's   Findings Negative    Side Right      Hawkins-Kennedy test   Findings Negative    Side Right      Empty Can test   Findings Negative    Side Right                      Objective measurements completed on examination: See above findings.       [facility] Adult PT Treatment/Exercise - 11/19/19 0001      Self-Care   Self-Care Other Self-Care Comments    Other Self-Care Comments  self trigger point release      Exercises   Exercises Neck      Manual Therapy   Manual Therapy Soft tissue mobilization;Manual Traction    Soft tissue mobilization trigger point release to upper trap     Manual Traction gentle manual traction       Neck Exercises: Stretches   Upper Trapezius Stretch 2 reps;30 seconds   R only   Levator Stretch 2 reps;30 seconds   R only   Other Neck Stretches Doorway stretch 2x30 sec                  PT Education - 11/18/19 1629    Education Details educated on dry needling; HEP    Person(s) Educated Patient    Methods Explanation;Demonstration;Tactile cues;Verbal cues;Handout    Comprehension Verbalized understanding;Returned demonstration;Verbal cues required;Tactile cues required            PT Short Term Goals - 11/18/19 1732      PT SHORT TERM GOAL #1   Title Patient will increase cervical rotation to 35 degrees R and L.    Time 3    Period Weeks    Status New    Target Date 12/10/19      PT SHORT TERM GOAL #2   Title Review FOTO    Time 3    Period Weeks  Status New    Target Date 12/10/19      PT SHORT TERM GOAL #3   Title Patient will increase shoulder flexion strength to 4+/5.    Time 3    Period Weeks    Status New    Target Date 12/10/19             PT Long Term Goals - 11/18/19 1733      PT LONG TERM GOAL #1   Title Patient  will increase cervical rotation to 70 degrees to be able to look both ways when crossing a street and driving a car.    Time 6    Period Weeks    Status New    Target Date 12/31/19      PT LONG TERM GOAL #2   Title Patient will be able to lift arms above head with no reports of pain to carry out IADL's.    Time 6    Period Weeks    Status New    Target Date 12/31/19                  Plan - 11/18/19 1631    Clinical Impression Statement Patient presents with neck and upper back pain. Upper trap tightness on the right. Cervical ROM is limited in all directions, with the greatest deficit in cervical rotation. Decreased shoulder flexion strength bilaterally. Patient had pain with elevating arms overhead and was tender to palpation in the area of subacromial bursa. Patient was negative for hawkins-kennedy and empty can. Patient would benefit from skilled therapy to increase shoulder strength, improve cervical ROM, and decrease pain and tightness in her upper neck and back.    Examination-Activity Limitations Reach Overhead    Examination-Participation Restrictions Cleaning    Stability/Clinical Decision Making Stable/Uncomplicated    Clinical Decision Making Low    Rehab Potential Good    PT Frequency 1x / week    PT Duration 4 weeks    PT Treatment/Interventions ADLs/Self Care Home Management;Cryotherapy;Ultrasound;Traction;Moist Heat;Functional mobility training;Therapeutic activities;Therapeutic exercise;Manual techniques;Patient/family education;Passive range of motion;Dry needling;Joint Manipulations;Spinal Manipulations;Taping;Vasopneumatic Device    PT Next Visit Plan assess tolerance to HEP; STW to upper traps, DN?, posterior chain strengthening, posture correction    PT Home Exercise Plan levator stretch, upper trap stretch, doorway stretch, self trigger point release    Consulted and Agree with Plan of Care Patient           Patient will benefit from skilled therapeutic  intervention in order to improve the following deficits and impairments:  Decreased range of motion, Decreased strength, Increased muscle spasms, Pain  Visit Diagnosis: Cervicalgia - Plan: PT plan of care cert/re-cert  Muscle weakness (generalized) - Plan: PT plan of care cert/re-cert  Cramp and spasm - Plan: PT plan of care cert/re-cert  Abnormal posture - Plan: PT plan of care cert/re-cert     Problem List Patient Active Problem List   Diagnosis Date Noted  . GERD (gastroesophageal reflux disease)   . Active labor 11/30/2013    Dessie Coma PT DPT  11/19/2019, 9:43 AM  Lowell Bouton SPTZ  11/19/2019  During this treatment session, the therapist was present, participating in and directing the treatment.   [facility] [address] [place], [place], [address] Phone: [phone]   Fax:  [phone]  Name: Saje Gallop MRN: [id number] Date of Birth: December 05, 1975

## 2019-11-19 NOTE — Patient Instructions (Signed)
Access Code: [email or web address] by: Onalee Hua CarrollExercises  Gentle Levator Scapulae Stretch - 1 x daily - 7 x weekly - 3 sets - 30 hold  Seated Upper Trapezius Stretch - 1 x daily - 7 x weekly - 3 sets - 30 hold  Doorway Pec Stretch at 60 Elevation - 1 x daily - 7 x weekly - 3 sets - 30 hold  Standing Upper Trapezius Mobilization with Small Ball - 1 x daily - 7 x weekly - 3 sets - 30 hold

## 2019-12-04 ENCOUNTER — Encounter: Payer: Self-pay | Admitting: Physical Therapy

## 2019-12-04 ENCOUNTER — Ambulatory Visit: Payer: 59 | Admitting: Physical Therapy

## 2019-12-04 ENCOUNTER — Other Ambulatory Visit: Payer: Self-pay

## 2019-12-04 DIAGNOSIS — M542 Cervicalgia: Secondary | ICD-10-CM | POA: Diagnosis not present

## 2019-12-04 DIAGNOSIS — M6281 Muscle weakness (generalized): Secondary | ICD-10-CM

## 2019-12-04 DIAGNOSIS — R293 Abnormal posture: Secondary | ICD-10-CM

## 2019-12-04 DIAGNOSIS — R252 Cramp and spasm: Secondary | ICD-10-CM

## 2019-12-04 NOTE — Therapy (Signed)
[facility] [address] [place], [place], [address] Phone: [phone]   Fax:  [phone]  Physical Therapy Treatment  Patient Details  Name: Samantha Gardner MRN: [id number] Date of Birth: 1976-02-23 Referring Provider (PT): Dr. Betty G [place]   Encounter Date: 12/04/2019   PT End of Session - 12/04/19 1012    Visit Number 2    Number of Visits 4    Date for PT Re-Evaluation 12/16/19    Authorization Type [employer] 20% co-pay    PT Start Time 0933    PT Stop Time 1014    PT Time Calculation (min) 41 min    Activity Tolerance Patient tolerated treatment well    Behavior During Therapy [facility] for tasks assessed/performed           Past Medical History:  Diagnosis Date   GERD (gastroesophageal reflux disease)     Past Surgical History:  Procedure Laterality Date   CESAREAN SECTION      There were no vitals filed for this visit.   Subjective Assessment - 12/04/19 1032    Subjective Patient reports that her pain and tightness has improved significantly . She has been using her stretches and exercises at home.    Pertinent History none    Limitations House hold activities    Diagnostic tests Xray: No acute abnormality noted.    Patient Stated Goals decrease pain; strengthen    Currently in Pain? No/denies                             [facility] Adult PT Treatment/Exercise - 12/04/19 0001      Self-Care   Other Self-Care Comments  self trigger point release with thera-cane and where to buy       Neck Exercises: Standing   Other Standing Exercises scap retraction 2x10 red and shoulder extension 2x10 red. Reviewed how to put in door at home       Manual Therapy   Manual Therapy Soft tissue mobilization;Manual Traction    Soft tissue mobilization trigger point release to upper trap     Manual Traction gentle manual traction       Neck Exercises: Stretches   Upper Trapezius Stretch 2 reps;30  seconds   R only   Levator Stretch 2 reps;30 seconds   R only   Other Neck Stretches Doorway stretch 2x30 sec                  PT Education - 12/04/19 1012    Education Details updated HEP; reviewed proper posture    Person(s) Educated Patient    Methods Explanation;Demonstration;Tactile cues;Handout;Verbal cues    Comprehension Verbalized understanding;Returned demonstration;Verbal cues required            PT Short Term Goals - 11/18/19 1732      PT SHORT TERM GOAL #1   Title Patient will increase cervical rotation to 35 degrees R and L.    Time 3    Period Weeks    Status New    Target Date 12/10/19      PT SHORT TERM GOAL #2   Title Review FOTO    Time 3    Period Weeks    Status New    Target Date 12/10/19      PT SHORT TERM GOAL #3   Title Patient will increase shoulder flexion strength to 4+/5.    Time 3  Period Weeks    Status New    Target Date 12/10/19             PT Long Term Goals - 11/18/19 1733      PT LONG TERM GOAL #1   Title Patient will increase cervical rotation to 70 degrees to be able to look both ways when crossing a street and driving a car.    Time 6    Period Weeks    Status New    Target Date 12/31/19      PT LONG TERM GOAL #2   Title Patient will be able to lift arms above head with no reports of pain to carry out IADL's.    Time 6    Period Weeks    Status New    Target Date 12/31/19                 Plan - 12/04/19 1026    Clinical Impression Statement Patient is making great rogress. She had no pain this morning. She reports the tightness has resolved. She has been working on her stretches. She feels like all of them have helped. Therapy gave her postural exercises today. she had a mild increase in tightness with postural exercises but improved with further manual therapy to the upper trap. Aptient continues to have mild tightness to palpation. Shefeels like she may need 1 more visit. Therapy will review HEP.  She has 2 more scheduled. If she does not need the last visit we will D/C to HEP and cancel the last visit.    Examination-Activity Limitations Reach Overhead    Examination-Participation Restrictions Cleaning    Stability/Clinical Decision Making Stable/Uncomplicated    Clinical Decision Making Low    Rehab Potential Good    PT Frequency 1x / week    PT Duration 4 weeks    PT Treatment/Interventions ADLs/Self Care Home Management;Cryotherapy;Ultrasound;Traction;Moist Heat;Functional mobility training;Therapeutic activities;Therapeutic exercise;Manual techniques;Patient/family education;Passive range of motion;Dry needling;Joint Manipulations;Spinal Manipulations;Taping;Vasopneumatic Device    PT Next Visit Plan assess tolerance to HEP; STW to upper traps, DN?, posterior chain strengthening, posture correction; if patient continues to do well FOTO and discharge    PT Home Exercise Plan levator stretch, upper trap stretch, doorway stretch, self trigger point release    Consulted and Agree with Plan of Care Patient           Patient will benefit from skilled therapeutic intervention in order to improve the following deficits and impairments:  Decreased range of motion, Decreased strength, Increased muscle spasms, Pain  Visit Diagnosis: Cervicalgia  Muscle weakness (generalized)  Cramp and spasm  Abnormal posture     Problem List Patient Active Problem List   Diagnosis Date Noted   GERD (gastroesophageal reflux disease)    Active labor 11/30/2013    Dessie Coma PT DPT  12/04/2019, 10:34 AM  [facility] [address] [place], [place], [address] Phone: [phone]   Fax:  [phone]  Name: Samantha Gardner MRN: [id number] Date of Birth: 1975-07-13

## 2019-12-09 ENCOUNTER — Ambulatory Visit: Payer: 59 | Attending: Family Medicine | Admitting: Physical Therapy

## 2019-12-09 ENCOUNTER — Telehealth: Payer: Self-pay | Admitting: Physical Therapy

## 2019-12-09 DIAGNOSIS — M6281 Muscle weakness (generalized): Secondary | ICD-10-CM | POA: Insufficient documentation

## 2019-12-09 DIAGNOSIS — R293 Abnormal posture: Secondary | ICD-10-CM | POA: Insufficient documentation

## 2019-12-09 DIAGNOSIS — M542 Cervicalgia: Secondary | ICD-10-CM | POA: Insufficient documentation

## 2019-12-09 DIAGNOSIS — R252 Cramp and spasm: Secondary | ICD-10-CM | POA: Insufficient documentation

## 2019-12-09 NOTE — Telephone Encounter (Signed)
Spoke with pt via interpreter- pt report she forgot about the appointment and will be at her next scheduled appointment.  Lekha Dancer C. Rashan Patient PT, DPT 12/09/19 5:09 PM

## 2019-12-16 ENCOUNTER — Ambulatory Visit: Payer: 59 | Admitting: Physical Therapy

## 2019-12-16 ENCOUNTER — Other Ambulatory Visit: Payer: Self-pay

## 2019-12-16 ENCOUNTER — Encounter: Payer: Self-pay | Admitting: Physical Therapy

## 2019-12-16 DIAGNOSIS — R293 Abnormal posture: Secondary | ICD-10-CM

## 2019-12-16 DIAGNOSIS — M6281 Muscle weakness (generalized): Secondary | ICD-10-CM | POA: Diagnosis present

## 2019-12-16 DIAGNOSIS — R252 Cramp and spasm: Secondary | ICD-10-CM | POA: Diagnosis present

## 2019-12-16 DIAGNOSIS — M542 Cervicalgia: Secondary | ICD-10-CM

## 2019-12-17 NOTE — Therapy (Signed)
[facility] [address] [place], [place], [address] Phone: [phone]   Fax:  [phone]  Physical Therapy Treatment/Discharge   Patient Details  Name: Samantha Gardner MRN: [id number] Date of Birth: 01/13/1976 Referring Provider (PT): Dr. Betty G [place]   Encounter Date: 12/16/2019   PT End of Session - 12/16/19 1631    Visit Number 3    Number of Visits 4    Date for PT Re-Evaluation 12/16/19    Authorization Type [employer] 20% co-pay    PT Start Time 1631    PT Stop Time 1709    PT Time Calculation (min) 38 min    Activity Tolerance Patient tolerated treatment well    Behavior During Therapy [facility] for tasks assessed/performed           Past Medical History:  Diagnosis Date  . GERD (gastroesophageal reflux disease)     Past Surgical History:  Procedure Laterality Date  . CESAREAN SECTION      There were no vitals filed for this visit.   Subjective Assessment - 12/16/19 1634    Subjective Pt reports her neck feels better. She is no longer having any pain.    Pertinent History none    Limitations House hold activities    Currently in Pain? No/denies    Pain Score 0-No pain    Pain Location Neck    Pain Orientation Right              [facility] PT Assessment - 12/17/19 0001      Assessment   Medical Diagnosis Upper back and neck pain    Referring Provider (PT) Dr. Betty G [place]      Observation/Other Assessments   Focus on Therapeutic Outcomes (FOTO)  12%      AROM   Cervical Flexion 60    Cervical Extension 50    Cervical - Right Rotation 72    Cervical - Left Rotation 63      Strength   Right Shoulder Flexion 4+/5    Right Elbow Flexion 4+/5    Right Elbow Extension 4+/5                         [facility] Adult PT Treatment/Exercise - 12/17/19 0001      Self-Care   Other Self-Care Comments  Educated pt to continue stretches and strengthening exercises at home to continue  improving and keep pain from returning      Neck Exercises: Standing   Other Standing Exercises Rows 2x10 green     Other Standing Exercises Shoulder extension 2x10 green band      Neck Exercises: Seated   Other Seated Exercise Bilateral ER 2x10 red band    Other Seated Exercise Horizontal Abduction 2x10 red band      Neck Exercises: Stretches   Upper Trapezius Stretch 2 reps;30 seconds   R only   Levator Stretch 2 reps;30 seconds   R only                 PT Education - 12/16/19 1714    Education Details updated and reviewed HEP; benefits of continuing stretches and exercises on her own; progression of ther-ex    Person(s) Educated Patient    Methods Explanation;Demonstration;Verbal cues;Tactile cues;Handout    Comprehension Verbalized understanding;Returned demonstration            PT Short Term Goals - 12/16/19 1705      PT  SHORT TERM GOAL #1   Title Patient will increase cervical rotation to 35 degrees R and L.    Baseline full cervical rotation    Time 3    Period Weeks    Status Achieved    Target Date 12/10/19      PT SHORT TERM GOAL #2   Title Review FOTO    Baseline reviewed    Time 3    Period Weeks    Status Achieved      PT SHORT TERM GOAL #3   Title Patient will increase shoulder flexion strength to 4+/5.    Baseline 4+/5    Time 3    Period Weeks    Status Achieved             PT Long Term Goals - 12/16/19 1706      PT LONG TERM GOAL #1   Title Patient will increase cervical rotation to 70 degrees to be able to look both ways when crossing a street and driving a car.    Time 6    Period Weeks    Status Partially Met      PT LONG TERM GOAL #2   Title Patient will be able to lift arms above head with no reports of pain to carry out IADL's.    Time 6    Period Weeks    Status Achieved                 Plan - 12/16/19 1715    Clinical Impression Statement Pt demonstrated significant progress in therapy. Her cervical and  shoulder ROM has improved to WNL in all directions with no reports of pain. Her shoulder flexion strength improved to a 4+/5. Pt improved FOTO score to 12% limitation. Therapy reviewed HEP and pt reports she feels confident to continue exercises on her own at home. Advised pt to continue being consistent with stretches and strengthening to decrease reoccurance of pain.    Examination-Activity Limitations Reach Overhead    Examination-Participation Restrictions Cleaning    Stability/Clinical Decision Making Stable/Uncomplicated    Clinical Decision Making Low    Rehab Potential Good    PT Frequency 1x / week    PT Duration 4 weeks    PT Treatment/Interventions ADLs/Self Care Home Management;Cryotherapy;Ultrasound;Traction;Moist Heat;Functional mobility training;Therapeutic activities;Therapeutic exercise;Manual techniques;Patient/family education;Passive range of motion;Dry needling;Joint Manipulations;Spinal Manipulations;Taping;Vasopneumatic Device    PT Next Visit Plan discharged    PT Home Exercise Plan levator stretch, upper trap stretch, doorway stretch, self trigger point release    Consulted and Agree with Plan of Care Patient           Patient will benefit from skilled therapeutic intervention in order to improve the following deficits and impairments:  Decreased range of motion, Decreased strength, Increased muscle spasms, Pain  Visit Diagnosis: Cervicalgia  Muscle weakness (generalized)  Cramp and spasm  Abnormal posture   PHYSICAL THERAPY DISCHARGE SUMMARY  Visits from Start of Care:   Current functional level related to goals / functional outcomes:  Back to doing all daily activity without pain   Remaining deficits: None    Education / Equipment: HEP   Plan: Patient agrees to discharge.  Patient goals were met. Patient is being discharged due to meeting the stated rehab goals.  ?????       Problem List Patient Active Problem List   Diagnosis Date Noted  .  GERD (gastroesophageal reflux disease)   . Active labor 11/30/2013    Grayling Congress  Kayleen Memos 12/17/2019, 10:03 AM  [facility] [address] [place], [place], [address] Phone: [phone]   Fax:  [phone]  Name: Samantha Gardner MRN: [id number] Date of Birth: 04/17/1976

## 2020-01-21 ENCOUNTER — Encounter: Payer: 59 | Admitting: Internal Medicine

## 2020-01-26 ENCOUNTER — Ambulatory Visit (INDEPENDENT_AMBULATORY_CARE_PROVIDER_SITE_OTHER): Payer: 59 | Admitting: Internal Medicine

## 2020-01-26 ENCOUNTER — Encounter: Payer: Self-pay | Admitting: Internal Medicine

## 2020-01-26 ENCOUNTER — Other Ambulatory Visit: Payer: Self-pay

## 2020-01-26 VITALS — BP 110/60 | HR 67 | Temp 98.4°F | Wt 148.9 lb

## 2020-01-26 DIAGNOSIS — Z124 Encounter for screening for malignant neoplasm of cervix: Secondary | ICD-10-CM

## 2020-01-26 DIAGNOSIS — D509 Iron deficiency anemia, unspecified: Secondary | ICD-10-CM | POA: Diagnosis not present

## 2020-01-26 DIAGNOSIS — Z23 Encounter for immunization: Secondary | ICD-10-CM | POA: Diagnosis not present

## 2020-01-26 DIAGNOSIS — K219 Gastro-esophageal reflux disease without esophagitis: Secondary | ICD-10-CM

## 2020-01-26 NOTE — Addendum Note (Signed)
Addended by: Lerry Liner on: 01/26/2020 09:14 AM   Modules accepted: Orders

## 2020-01-26 NOTE — Addendum Note (Signed)
Addended by: Kern Reap B on: 01/26/2020 10:16 AM   Modules accepted: Orders

## 2020-01-26 NOTE — Addendum Note (Signed)
Addended by: Kern Reap B on: 01/26/2020 01:03 PM   Modules accepted: Orders

## 2020-01-26 NOTE — Progress Notes (Signed)
Established Patient Office Visit     This visit occurred during the SARS-CoV-2 public health emergency.  Safety protocols were in place, including screening questions prior to the visit, additional usage of staff PPE, and extensive cleaning of exam room while observing appropriate contact time as indicated for disinfecting solutions.    CC/Reason for Visit: Requesting flu vaccine, labs to follow iron deficiency, GYN referral  HPI: Samantha Gardner is a 44 y.o. female who is coming in today for the above mentioned reasons. Past Medical History is significant for: GERD on daily PPI therapy. She has noticed heavy periods, she has a history of iron deficiency anemia. She was told last year that she was very close to needing iron infusions, however she lost her insurance and was not able to follow-up. She is requesting lab work today to follow-up on her hemoglobin and iron counts. She is not having excessive fatigue, chest pains or shortness of breath. She is requesting permanent sterilization.   Past Medical/Surgical History: Past Medical History:  Diagnosis Date  . GERD (gastroesophageal reflux disease)     Past Surgical History:  Procedure Laterality Date  . CESAREAN SECTION      Social History:  reports that she has never smoked. She has never used smokeless tobacco. She reports that she does not drink alcohol and does not use drugs.  Allergies: No Known Allergies  Family History:  No history of heart disease, cancer, stroke that she is aware of  Current Outpatient Medications:  .  cyclobenzaprine (FLEXERIL) 10 MG tablet, Take 1 tablet (10 mg total) by mouth 2 (two) times daily as needed for muscle spasms., Disp: 20 tablet, Rfl: 0 .  ferrous sulfate 325 (65 FE) MG EC tablet, Take 1 tablet by mouth in the morning and at bedtime., Disp: , Rfl:  .  pantoprazole (PROTONIX) 40 MG tablet, Take 1 tablet (40 mg total) by mouth daily., Disp: 90 tablet, Rfl: 1  Review of  Systems:  Constitutional: Denies fever, chills, diaphoresis, appetite change and fatigue.  HEENT: Denies photophobia, eye pain, redness, hearing loss, ear pain, congestion, sore throat, rhinorrhea, sneezing, mouth sores, trouble swallowing, neck pain, neck stiffness and tinnitus.   Respiratory: Denies SOB, DOE, cough, chest tightness,  and wheezing.   Cardiovascular: Denies chest pain, palpitations and leg swelling.  Gastrointestinal: Denies nausea, vomiting, abdominal pain, diarrhea, constipation, blood in stool and abdominal distention.  Genitourinary: Denies dysuria, urgency, frequency, hematuria, flank pain and difficulty urinating.  Endocrine: Denies: hot or cold intolerance, sweats, changes in hair or nails, polyuria, polydipsia. Musculoskeletal: Denies myalgias, back pain, joint swelling, arthralgias and gait problem.  Skin: Denies pallor, rash and wound.  Neurological: Denies dizziness, seizures, syncope, weakness, light-headedness, numbness and headaches.  Hematological: Denies adenopathy. Easy bruising, personal or family bleeding history  Psychiatric/Behavioral: Denies suicidal ideation, mood changes, confusion, nervousness, sleep disturbance and agitation    Physical Exam: Vitals:   01/26/20 0854  BP: 110/60  Pulse: 67  Temp: 98.4 F (36.9 C)  TempSrc: Oral  SpO2: 97%  Weight: 148 lb 14.4 oz (67.5 kg)    Body mass index is 26.38 kg/m.   Constitutional: NAD, calm, comfortable Eyes: PERRL, lids and conjunctivae normal ENMT: Mucous membranes are moist.  Respiratory: clear to auscultation bilaterally, no wheezing, no crackles. Normal respiratory effort. No accessory muscle use.  Cardiovascular: Regular rate and rhythm, no murmurs / rubs / gallops. No extremity edema Neurologic: Grossly intact and nonfocal. Psychiatric: Normal judgment and insight. Alert and  oriented x 3. Normal mood.    Impression and Plan:  Gastroesophageal reflux disease without  esophagitis -Well-controlled on daily PPI therapy.  Iron deficiency anemia, unspecified iron deficiency anemia type  - Plan: CBC with Differential/Platelet, Iron, TIBC and Ferritin Panel -Referral to GYN due to history of heavy periods and also desire for sterilization.  Need for influenza vaccination -Flu vaccine administered today     Deniz Eskridge Philip Aspen, MD [facility]

## 2020-01-27 LAB — CBC WITH DIFFERENTIAL/PLATELET
Absolute Monocytes: 431 cells/uL (ref 200–950)
Basophils Absolute: 28 cells/uL (ref 0–200)
Basophils Relative: 0.5 %
Eosinophils Absolute: 129 cells/uL (ref 15–500)
Eosinophils Relative: 2.3 %
HCT: 38.8 % (ref 35.0–45.0)
Hemoglobin: 12.4 g/dL (ref 11.7–15.5)
Lymphs Abs: 1546 cells/uL (ref 850–3900)
MCH: 26.2 pg — ABNORMAL LOW (ref 27.0–33.0)
MCHC: 32 g/dL (ref 32.0–36.0)
MCV: 81.9 fL (ref 80.0–100.0)
MPV: 10.7 fL (ref 7.5–12.5)
Monocytes Relative: 7.7 %
Neutro Abs: 3466 cells/uL (ref 1500–7800)
Neutrophils Relative %: 61.9 %
Platelets: 221 10*3/uL (ref 140–400)
RBC: 4.74 10*6/uL (ref 3.80–5.10)
RDW: 13.8 % (ref 11.0–15.0)
Total Lymphocyte: 27.6 %
WBC: 5.6 10*3/uL (ref 3.8–10.8)

## 2020-01-27 LAB — IRON,TIBC AND FERRITIN PANEL
%SAT: 9 % (calc) — ABNORMAL LOW (ref 16–45)
Ferritin: 19 ng/mL (ref 16–232)
Iron: 33 ug/dL — ABNORMAL LOW (ref 40–190)
TIBC: 374 mcg/dL (calc) (ref 250–450)

## 2020-02-18 ENCOUNTER — Encounter: Payer: Self-pay | Admitting: Nurse Practitioner

## 2020-02-18 ENCOUNTER — Ambulatory Visit (INDEPENDENT_AMBULATORY_CARE_PROVIDER_SITE_OTHER): Payer: 59 | Admitting: Nurse Practitioner

## 2020-02-18 ENCOUNTER — Other Ambulatory Visit: Payer: Self-pay

## 2020-02-18 VITALS — BP 120/68 | Ht 63.0 in | Wt 147.0 lb

## 2020-02-18 DIAGNOSIS — Z3009 Encounter for other general counseling and advice on contraception: Secondary | ICD-10-CM | POA: Diagnosis not present

## 2020-02-18 DIAGNOSIS — N920 Excessive and frequent menstruation with regular cycle: Secondary | ICD-10-CM

## 2020-02-18 NOTE — Patient Instructions (Signed)
Levonorgestrel intrauterine device (IUD) O que  este medicamento? O LEVONORGESTREL IUD (DIU)  um dispositivo contraceptivo (anticoncepcional). Este dispositivo  colocado dentro do tero por um profissional de sade.  usado para prevenir a Environmental education officer. Este dispositivo tambm pode ser utilizado para tratar a hemorragia intensa que ocorre durante o seu perodo menstrual. Este medicamento pode ser usado para outros propsitos; em caso de dvidas, pergunte ao seu profissional de sade ou Social research officer, government. NOMES DE MARCAS COMUNS: Rutha Bouchard, LILETTA, Mirena, Skyla O que devo dizer a meu profissional de sade antes de tomar este medicamento? Precisam saber se voc tem algum dos seguintes problemas ou estados de sade:  Papanicolau anormal  cncer de mama, do tero ou do colo do tero  diabetes  endometrite  infeco genital ou plvica atual ou no passado  tem mais de um parceiro sexual ou seu parceiro tem mais de um parceiro sexual  doenas cardacas  histrico de Environmental education officer ectpica ou tubria  problemas do sistema imunitrio  DIU implantado  doena ou tumor no fgado  problemas com cogulos sanguneos ou tomando medicamentos que afinam o sangue  convulses (crises convulsivas)  [place] drogas injetveis  tero com formato irregular  sangramento vaginal que ainda no foi explicado  reao estranha ou alergia ao levonorgestrel, a outros hormnios, ao silicone ou ao polietileno  reao estranha ou alergia a outros medicamentos, alimentos, corantes ou conservantes  est grvida ou tentando engravidar  est amamentando Como devo usar este medicamento? Este dispositivo  colocado dentro do tero por um profissional de sade. Fale com seu pediatra a respeito do uso deste medicamento em crianas. Pode ser preciso tomar alguns cuidados especiais. Superdosagem: Se achar que tomou uma superdosagem deste medicamento, entre em contato imediatamente com o [facility]tox[facility] ou v  a [employer]. OBSERVAO: Este medicamento  s para voc. No compartilhe este medicamento com outras pessoas. E se eu deixar de tomar uma dose? Isto no se aplica. Se deseja continuar usando este tipo de [employer], ele dever ser substitudo a cada 3 a 6 anos, dependendo da marca do dispositivo inserido. O que pode interagir com este medicamento? No tome este medicamento com nenhum dos seguintes:  amprenavir  bosentana  fosamprenavir Este medicamento tambm pode interagir com os seguintes remdios:  aprepitanto  armodafinila  barbitricos para dormir ou para tratar crises convulsivas  bexaroteno  boceprevir  griseofulvina  alguns medicamentos para crises convulsivas, como carbamazepina, [place], [place], [place], [place], topiramato  modafinila  pioglitazona  rifabutina  rifampicina  rifapentina  alguns medicamentos contra a infeco pelo HIV, como atazanavir, efavirenz, indinavir, lopinavir, nelfinavir, tipranavir e ritonavir  erva-de-so-joo  varfarina Esta lista pode no descrever todas as interaes possveis. D ao seu profissional de sade uma lista de todos os medicamentos, ervas medicinais, remdios de venda livre, ou suplementos alimentares que voc [place]. Diga tambm se voc fuma, bebe, ou [place] drogas ilcitas. Alguns destes podem interagir com o seu medicamento. Ao que devo ficar atento quando estiver [place] medicamento? Consulte seu mdico ou profissional de sade para acompanhamento regular Dealer. Consulte o seu mdico se voc ou seu parceiro tiverem contato sexual com outras pessoas, se tornar-se HIV positiva ou se contrair uma doena transmitida sexualmente. Este medicamento no protege contra uma possvel infeco pelo HIV, AIDS ou outras doenas sexualmente transmissveis. Voc pode verificar a colocao do DIU tocando a parte superior da vagina com os dedos limpos para apalpar os fios. No puxe os fios.  um  bom hbito verificar o posicionamento  do dispositivo aps cada perodo menstrual. Entre imediatamente em contato com o seu mdico ou profissional de sade se seus dedos apalparem mais do corpo do DIU do que apenas os fios ou se no puder apalpar os fios. O DIU pode sair do corpo. Voc pode engravidar se o dispositivo sair. Se voc notar que o DIU est deslocado, use um mtodo anticoncepcional secundrio, como preservativos, e entre em contato com o seu mdico ou profissional de sade. Absorventes internos no interferem com a posio do DIU e podem ser usados durante o seu perodo menstrual. Portadoras deste DIU s podem passar por uma ressonncia magntica (RM) com segurana sob condies especficas. Antes de passar por uma RM, informe o seu mdico ou profissional de sade que tem um DIU implantado e qual  o tipo do dispositivo. Que efeitos colaterais posso sentir aps usar este medicamento? Efeitos colaterais que devem ser informados ao seu mdico ou profissional de sade o mais rpido possvel:  reaes alrgicas, como erupo na pele, coceira, urticria, ou inchao do rosto, dos lbios ou da lngua  febre ou sintomas gripais  lceras genitais  presso alta  ausncia de [place] por 6 semanas durante o uso  dor, inchao, calor na perna  dor plvica ou sensibilidade ao toque  dor de cabea forte ou sbita  sinais de gravidez  clicas abdominais  falta de ar sbita  dificuldades de equilbrio, para falar, para andar  sangramento ou corrimento vaginal fora do comum  olhos ou pele amarelados Efeitos colaterais que normalmente no precisam de cuidados mdicos (avise ao seu mdico ou profissional de sade se persistirem ou forem incmodos):  acne  dor nos seios  mudana na libido ou no desempenho sexual  ganho ou perda de peso  clicas, tonturas ou desmaio durante a insero do dispositivo  dor de cabea  menstruao irregular durante os primeiros 3 a 6 meses de  uso  enjoo Esta lista pode no descrever todos os efeitos colaterais possveis. Para mais orientaes sobre efeitos colaterais, consulte o seu mdico. Voc pode relatar a ocorrncia de efeitos colaterais  FDA pelo telefone [phone]. Onde devo guardar meu medicamento? Isto no se aplica. OBSERVAO: Este folheto  um resumo. Pode no cobrir todas as informaes possveis. Se tiver dvidas a respeito deste medicamento, fale com seu mdico, farmacutico ou profissional de sade.  2020 Elsevier/Gold Standard (2018-05-15 00:00:00)

## 2020-02-18 NOTE — Progress Notes (Signed)
   Acute Office Visit  Subjective:    Patient ID: Samantha Gardner, female    DOB: February 11, 1976, 44 y.o.   MRN: [id number]   HPI 44 y.o. presents as new patient for menorrhagia with regular cycles.  Her cycles are every 28 days with heavy bleeding the first 2 days and then tapering off.  LMP 01/22/2020.  She is interested in a tubal ligation.  Has not been on hormonal contraception in the past. Interpreter present by telephone during visit.    Review of Systems  Constitutional: Negative.   Genitourinary: Positive for menstrual problem.       Objective:    Physical Exam Constitutional:      Appearance: Normal appearance.  Genitourinary:    General: Normal vulva.     Vagina: Normal.     Cervix: Normal.     Uterus: Normal.      BP 120/68 (BP Location: Right Arm, Patient Position: Sitting, Cuff Size: Normal)   Ht 5\' 3"  (1.6 m)   Wt 147 lb (66.7 kg)   LMP 01/22/2020   BMI 26.04 kg/m  Wt Readings from Last 3 Encounters:  02/18/20 147 lb (66.7 kg)  01/26/20 148 lb 14.4 oz (67.5 kg)  11/02/19 149 lb (67.6 kg)        Assessment & Plan:   Problem List Items Addressed This Visit    None    Visit Diagnoses    Menorrhagia with regular cycle    -  Primary   Encounter for other general counseling or advice on contraception         Plan: We discussed the risk of tubal ligation and that although this will prevent pregnancy it will not improve her cycles. We discussed contraception being a good option for her for pregnancy prevention and managing heavy bleeding. We went over the pill, patch, vaginal ring, injectable, implant, and IUD.  She is interested in the Mirena IUD.  Will check coverage and schedule insertion with Dr. 11/04/19.  Recommend taking 600 mg ibuprofen one hour prior to appointment. Written and verbal education provided on risks and what to expect. She is agreeable to plan.       Penni Bombard [facility], 10:21 AM 02/18/2020

## 2020-04-14 ENCOUNTER — Other Ambulatory Visit: Payer: Self-pay | Admitting: Internal Medicine

## 2020-04-14 DIAGNOSIS — K219 Gastro-esophageal reflux disease without esophagitis: Secondary | ICD-10-CM

## 2020-04-18 ENCOUNTER — Other Ambulatory Visit: Payer: 59

## 2020-05-24 ENCOUNTER — Encounter: Payer: 59 | Admitting: Internal Medicine

## 2020-06-17 ENCOUNTER — Ambulatory Visit (INDEPENDENT_AMBULATORY_CARE_PROVIDER_SITE_OTHER): Payer: 59 | Admitting: Internal Medicine

## 2020-06-17 ENCOUNTER — Encounter: Payer: Self-pay | Admitting: Internal Medicine

## 2020-06-17 ENCOUNTER — Other Ambulatory Visit: Payer: Self-pay

## 2020-06-17 ENCOUNTER — Other Ambulatory Visit: Payer: Self-pay | Admitting: Internal Medicine

## 2020-06-17 VITALS — BP 102/64 | HR 76 | Temp 98.4°F | Ht 64.0 in | Wt 147.9 lb

## 2020-06-17 DIAGNOSIS — E559 Vitamin D deficiency, unspecified: Secondary | ICD-10-CM

## 2020-06-17 DIAGNOSIS — Z1239 Encounter for other screening for malignant neoplasm of breast: Secondary | ICD-10-CM | POA: Diagnosis not present

## 2020-06-17 DIAGNOSIS — Z0000 Encounter for general adult medical examination without abnormal findings: Secondary | ICD-10-CM | POA: Diagnosis not present

## 2020-06-17 LAB — LIPID PANEL
Cholesterol: 167 mg/dL (ref 0–200)
HDL: 50.2 mg/dL (ref >39.00)
LDL Cholesterol: 92 mg/dL (ref 0–99)
NonHDL: 116.61
Total CHOL/HDL Ratio: 3
Triglycerides: 125 mg/dL (ref 0.0–149.0)
VLDL: 25 mg/dL (ref 0.0–40.0)

## 2020-06-17 LAB — COMPREHENSIVE METABOLIC PANEL
ALT: 12 U/L (ref 0–35)
AST: 15 U/L (ref 0–37)
Albumin: 4.1 g/dL (ref 3.5–5.2)
Alkaline Phosphatase: 43 U/L (ref 39–117)
BUN: 13 mg/dL (ref 6–23)
CO2: 28 mEq/L (ref 19–32)
Calcium: 9 mg/dL (ref 8.4–10.5)
Chloride: 105 mEq/L (ref 96–112)
Creatinine, Ser: 0.68 mg/dL (ref 0.40–1.20)
GFR: 107.29 mL/min (ref >60.00)
Glucose, Bld: 85 mg/dL (ref 70–99)
Potassium: 3.9 mEq/L (ref 3.5–5.1)
Sodium: 139 mEq/L (ref 135–145)
Total Bilirubin: 0.4 mg/dL (ref 0.2–1.2)
Total Protein: 7.3 g/dL (ref 6.0–8.3)

## 2020-06-17 LAB — CBC WITH DIFFERENTIAL/PLATELET
Basophils Absolute: 0 10*3/uL (ref 0.0–0.1)
Basophils Relative: 0.7 % (ref 0.0–3.0)
Eosinophils Absolute: 0.1 10*3/uL (ref 0.0–0.7)
Eosinophils Relative: 2 % (ref 0.0–5.0)
HCT: 39.9 % (ref 36.0–46.0)
Hemoglobin: 12.7 g/dL (ref 12.0–15.0)
Lymphocytes Relative: 27.5 % (ref 12.0–46.0)
Lymphs Abs: 1.7 10*3/uL (ref 0.7–4.0)
MCHC: 32 g/dL (ref 30.0–36.0)
MCV: 77.3 fl — ABNORMAL LOW (ref 78.0–100.0)
Monocytes Absolute: 0.4 10*3/uL (ref 0.1–1.0)
Monocytes Relative: 6.8 % (ref 3.0–12.0)
Neutro Abs: 3.8 10*3/uL (ref 1.4–7.7)
Neutrophils Relative %: 63 % (ref 43.0–77.0)
Platelets: 227 10*3/uL (ref 150.0–400.0)
RBC: 5.15 Mil/uL — ABNORMAL HIGH (ref 3.87–5.11)
RDW: 15.2 % (ref 11.5–15.5)
WBC: 6 10*3/uL (ref 4.0–10.5)

## 2020-06-17 LAB — TSH: TSH: 3.27 u[IU]/mL (ref 0.35–4.50)

## 2020-06-17 LAB — VITAMIN D 25 HYDROXY (VIT D DEFICIENCY, FRACTURES): VITD: 16.45 ng/mL — ABNORMAL LOW (ref 30.00–100.00)

## 2020-06-17 LAB — VITAMIN B12: Vitamin B-12: 333 pg/mL (ref 211–911)

## 2020-06-17 MED ORDER — VITAMIN D (ERGOCALCIFEROL) 1.25 MG (50000 UNIT) PO CAPS: 50000.0000 [IU] | ORAL_CAPSULE | ORAL | 0 refills | Status: AC

## 2020-06-17 NOTE — Progress Notes (Signed)
Established Patient Office Visit     This visit occurred during the SARS-CoV-2 public health emergency.  Safety protocols were in place, including screening questions prior to the visit, additional usage of staff PPE, and extensive cleaning of exam room while observing appropriate contact time as indicated for disinfecting solutions.    CC/Reason for Visit: Annual preventive exam  HPI: Samantha Gardner is a 45 y.o. female who is coming in today for the above mentioned reasons. Past Medical History is significant for: GERD who was no longer taking daily PPI therapy as well as iron deficiency anemia due to menometrorrhagia.  She visited her home country of [place] over the holidays and on December 27 she had a hysterectomy with bilateral salpingectomy.  She has recovered well.  She has no acute complaints today.  She does not have routine eye or dental care.  She is overdue for mammogram.   Past Medical/Surgical History: Past Medical History:  Diagnosis Date  . GERD (gastroesophageal reflux disease)     Past Surgical History:  Procedure Laterality Date  . CESAREAN SECTION    . hysterectomy     In [place] Dec 2021    Social History:  reports that she has never smoked. She has never used smokeless tobacco. She reports that she does not drink alcohol and does not use drugs.  Allergies: No Known Allergies  Family History:  No history of heart disease, cancer, stroke that she is aware of  Current Outpatient Medications:  .  ferrous sulfate 325 (65 FE) MG EC tablet, Take 1 tablet by mouth in the morning and at bedtime., Disp: , Rfl:  .  pantoprazole (PROTONIX) 40 MG tablet, TAKE 1 TABLET(40 MG) BY MOUTH DAILY, Disp: 90 tablet, Rfl: 1  Review of Systems:  Constitutional: Denies fever, chills, diaphoresis, appetite change and fatigue.  HEENT: Denies photophobia, eye pain, redness, hearing loss, ear pain, congestion, sore throat, rhinorrhea, sneezing, mouth sores, trouble  swallowing, neck pain, neck stiffness and tinnitus.   Respiratory: Denies SOB, DOE, cough, chest tightness,  and wheezing.   Cardiovascular: Denies chest pain, palpitations and leg swelling.  Gastrointestinal: Denies nausea, vomiting, abdominal pain, diarrhea, constipation, blood in stool and abdominal distention.  Genitourinary: Denies dysuria, urgency, frequency, hematuria, flank pain and difficulty urinating.  Endocrine: Denies: hot or cold intolerance, sweats, changes in hair or nails, polyuria, polydipsia. Musculoskeletal: Denies myalgias, back pain, joint swelling, arthralgias and gait problem.  Skin: Denies pallor, rash and wound.  Neurological: Denies dizziness, seizures, syncope, weakness, light-headedness, numbness and headaches.  Hematological: Denies adenopathy. Easy bruising, personal or family bleeding history  Psychiatric/Behavioral: Denies suicidal ideation, mood changes, confusion, nervousness, sleep disturbance and agitation    Physical Exam: Vitals:   06/17/20 0701  BP: 102/64  Pulse: 76  Temp: 98.4 F (36.9 C)  TempSrc: Oral  SpO2: 98%  Weight: 147 lb 14.4 oz (67.1 kg)  Height: $Remove'5\' 4"'cYevtnU$  (1.626 m)    Body mass index is 25.39 kg/m.   Constitutional: NAD, calm, comfortable Eyes: PERRL, lids and conjunctivae normal ENMT: Mucous membranes are moist. Posterior pharynx clear of any exudate or lesions. Normal dentition. Tympanic membrane is pearly white, no erythema or bulging. Neck: normal, supple, no masses, no thyromegaly Respiratory: clear to auscultation bilaterally, no wheezing, no crackles. Normal respiratory effort. No accessory muscle use.  Cardiovascular: Regular rate and rhythm, no murmurs / rubs / gallops. No extremity edema. 2+ pedal pulses.  Abdomen: no tenderness, no masses palpated. No hepatosplenomegaly. Bowel sounds  positive.  Musculoskeletal: no clubbing / cyanosis. No joint deformity upper and lower extremities. Good ROM, no contractures. Normal  muscle tone.  Skin: no rashes, lesions, ulcers. No induration Neurologic: CN 2-12 grossly intact. Sensation intact, DTR normal. Strength 5/5 in all 4.  Psychiatric: Normal judgment and insight. Alert and oriented x 3. Normal mood.    Impression and Plan:  Encounter for preventive health examination  -Advised routine eye and dental care. -Immunizations are up-to-date except for her Covid booster which she has been advised to get a soon as possible at the pharmacy. -Screening labs today. -Healthy lifestyle discussed in detail. -She tells me she had a Pap smear prior to her hysterectomy in December 2021 and was told it was negative. -She is overdue for screening mammogram, I will order. -Commence routine colon cancer screening age 59.  Screening breast examination  - Plan: MM Digital Screening    Patient Instructions   -Recuerda su booster de COVID y su examen de la vista.   Cuidados preventivos en las mujeres de 55 a 24 aos de edad Preventive Care 79-32 Years Old, Female Los cuidados preventivos hacen referencia a las opciones en cuanto al estilo de vida y a las visitas al mdico, las cuales pueden promover la salud y Musician. Esto puede comprender lo siguiente:  Un examen fsico anual. Esto tambin se conoce como visita de control de bienestar anual.  Exmenes dentales y oculares de [place] regular.  Vacunas.  Estudios para Health and safety inspector.  Elecciones para un estilo de vida saludable, por ejemplo: ? Seguir una dieta saludable. ? Practicar actividad fsica con regularidad. ? No consumir drogas ni productos que contengan nicotina y tabaco. ? Limitar el consumo de bebidas alcohlicas. Qu puedo esperar para mi visita de cuidado preventivo? Examen fsico El mdico revisar lo siguiente:  IT consultant y [place]. Estos pueden usarse para calcular el IMC (ndice de masa corporal). El [facility] es una medicin que indica si tiene un peso saludable.  Frecuencia cardaca  y presin arterial.  Temperatura corporal.  Piel para detectar manchas anormales. Asesoramiento Su mdico puede preguntarle acerca de:  Problemas mdicos pasados.  Antecedentes mdicos familiares.  Consumo de tabaco, alcohol y drogas.  Su bienestar emocional.  Restaurant manager, fast food y las relaciones personales.  Su actividad sexual.  Hbitos de alimentacin, ejercicio y sueo.  Su trabajo y [place] laboral.  Acceso a armas de fuego.  Mtodos anticonceptivos.  Ciclo menstrual.  Antecedentes de embarazo. Qu vacunas necesito? Las vacunas se aplican a varias edades, segn un calendario. El [employer] recomendar vacunas segn su edad, sus antecedentes mdicos, su estilo de vida y otros factores, como los viajes o el lugar donde trabaja.   Qu pruebas necesito? Anlisis de [employer] de lpidos y colesterol. Estos se pueden verificar cada 5 aos, o ms a menudo, si usted tiene ms de 64 aos de edad.  Anlisis de hepatitis C.  Anlisis de hepatitis B. Pruebas de deteccin  Pruebas de deteccin de cncer de pulmn. Es posible que se le realice esta prueba de deteccin a partir de los 21 aos de edad, si ha fumado durante 30 aos un paquete diario y sigue fumando o dej el hbito en algn momento en los ltimos 15 aos.  Pruebas de Programme researcher, broadcasting/film/video de Surveyor, minerals. ? Todos los adultos a Proofreader de los 14 aos de edad y [place] 88 aos de edad deben hacerse esta prueba de deteccin. ? El mdico puede recomendarle las pruebas de deteccin  a partir de los 62 aos de edad si corre Magazine features editor. ? Le realizarn pruebas cada 1 a 10 aos, segn los [place] y el tipo de prueba de Programme researcher, broadcasting/film/video.  Pruebas de deteccin de la diabetes. ? Esto se Set designer un control del azcar en la sangre (glucosa) despus de no haber comido durante un periodo de tiempo (ayuno). ? Es posible que se le realice esta prueba cada 1 a 3 aos.  Mamografa. ? Se puede realizar cada 1 o 2  aos. ? Hable con su mdico sobre cundo debe comenzar a realizarse mamografas de [place] regular. Esto depende de si tiene antecedentes familiares de cncer de mama o no.  Pruebas de deteccin de cncer relacionado con las mutaciones del BRCA. Es posible que se las deba realizar si tiene antecedentes de cncer de mama, de ovario, de trompas o peritoneal.  Examen plvico y prueba de Papanicolaou. ? Esto se puede realizar cada 37aos a [place] de los 21aos de edad. ? A partir de los 30 aos, esto se puede Optometrist cada 5 aos si usted se realiza una prueba de Papanicolaou en combinacin con una prueba de deteccin del virus del papiloma humano (VPH). Otras pruebas  Pruebas de enfermedades de transmisin sexual (ETS), si est en riesgo.  Densitometra sea. Esto se realiza para detectar osteoporosis. Se le puede realizar este examen de deteccin si tiene un riesgo alto de tener osteoporosis. Hable con su mdico [employer], las opciones de tratamiento y, si corresponde, la necesidad de Optometrist ms pruebas. Siga estas instrucciones en su casa: Comida y bebida  Siga una dieta que incluya frutas y verduras frescas, cereales integrales, protenas magras y productos lcteos descremados.  Tome los suplementos vitamnicos y [employer] se lo haya indicado el mdico.  No beba alcohol si: ? Su mdico le indica no hacerlo. ? Est embarazada, puede estar embarazada o est tratando de quedar embarazada.  Si bebe alcohol: ? Limite la cantidad que consume de 0 a 1 medida por da. ? Est atenta a la cantidad de alcohol que hay en las bebidas que toma. En los [place], una medida equivale a una botella de cerveza de 12oz (340ml), un vaso de vino de 5oz (171ml) o un vaso de una bebida alcohlica de alta graduacin de 1oz (40ml).   Estilo de [employer] y las encas a diario. Cepllese los dientes a la maana y a la noche con pasta dental con  fluoruro. Use hilo dental una vez al da.  Mantngase activa. Haga al menos 30 minutos de ejercicio, 5 o ms das cada semana.  No consuma ningn producto que contenga nicotina o tabaco, como cigarrillos, cigarrillos electrnicos y tabaco de Higher education careers adviser. Si necesita ayuda para dejar de fumar, consulte al mdico.  No consuma drogas.  Si es sexualmente activa, practique sexo seguro. Use un condn u otra forma de proteccin para prevenir las ITS (infecciones de transmisin sexual).  Si no desea quedar embarazada, use un mtodo anticonceptivo. Si busca un embarazo, realice una consulta previa al [employer] con el mdico.  Si el mdico se lo indic, tome una dosis baja de aspirina diariamente a partir de los 62 aos de [place].  Encuentre formas saludables de lidiar con el estrs tales como: ? Meditacin, yoga o escuchar msica. ? Lleve un diario personal. ? Hable con una persona confiable. ? Pase tiempo con amigos y familiares. Seguridad  [place] siempre el cinturn de seguridad al conducir o Government social research officer  en un vehculo.  No conduzca: ? Si ha estado bebiendo alcohol. No viaje con un conductor que ha estado bebiendo. ? Si est cansada o distrada. ? Mientras est enviando mensajes de texto.  Use un casco y otros equipos de proteccin [place] deportivas.  Si tiene armas de fuego en su casa, asegrese de seguir todos los procedimientos de seguridad correspondientes. Cundo volver?  Visite al mdico una vez al ao para una visita anual de control de bienestar.  Pregntele al mdico con qu frecuencia debe realizarse un control de la vista y los dientes.  Mantenga su esquema de vacunacin al da. Esta informacin no tiene Marine scientist el consejo del mdico. Asegrese de hacerle al mdico cualquier pregunta que tenga. Document Revised: 02/24/2020 Document Reviewed: 02/24/2020 Elsevier Patient Education  2021 Aurelia, MD [facility]  at [facility]

## 2020-06-17 NOTE — Patient Instructions (Signed)
-Recuerda su booster de COVID y su examen de la vista.   Cuidados preventivos en las mujeres de 43 a 26 aos de edad Preventive Care 68-43 Years Old, Female Los cuidados preventivos hacen referencia a las opciones en cuanto al estilo de vida y a las visitas al mdico, las cuales pueden promover la salud y Musician. Esto puede comprender lo siguiente:  Un examen fsico anual. Esto tambin se conoce como visita de control de bienestar anual.  Exmenes dentales y oculares de [place] regular.  Vacunas.  Estudios para Health and safety inspector.  Elecciones para un estilo de vida saludable, por ejemplo: ? Seguir una dieta saludable. ? Practicar actividad fsica con regularidad. ? No consumir drogas ni productos que contengan nicotina y tabaco. ? Limitar el consumo de bebidas alcohlicas. Qu puedo esperar para mi visita de cuidado preventivo? Examen fsico El mdico revisar lo siguiente:  IT consultant y [place]. Estos pueden usarse para calcular el IMC (ndice de masa corporal). El [facility] es una medicin que indica si tiene un peso saludable.  Frecuencia cardaca y presin arterial.  Temperatura corporal.  Piel para detectar manchas anormales. Asesoramiento Su mdico puede preguntarle acerca de:  Problemas mdicos pasados.  Antecedentes mdicos familiares.  Consumo de tabaco, alcohol y drogas.  Su bienestar emocional.  Restaurant manager, fast food y las relaciones personales.  Su actividad sexual.  Hbitos de alimentacin, ejercicio y sueo.  Su trabajo y [place] laboral.  Acceso a armas de fuego.  Mtodos anticonceptivos.  Ciclo menstrual.  Antecedentes de embarazo. Qu vacunas necesito? Las vacunas se aplican a varias edades, segn un calendario. El [employer] recomendar vacunas segn su edad, sus antecedentes mdicos, su estilo de vida y otros factores, como los viajes o el lugar donde trabaja.   Qu pruebas necesito? Anlisis de [employer] de lpidos y  colesterol. Estos se pueden verificar cada 5 aos, o ms a menudo, si usted tiene ms de 22 aos de edad.  Anlisis de hepatitis C.  Anlisis de hepatitis B. Pruebas de deteccin  Pruebas de deteccin de cncer de pulmn. Es posible que se le realice esta prueba de deteccin a partir de los 66 aos de edad, si ha fumado durante 30 aos un paquete diario y sigue fumando o dej el hbito en algn momento en los ltimos 15 aos.  Pruebas de Programme researcher, broadcasting/film/video de Surveyor, minerals. ? Todos los adultos a Proofreader de los 16 aos de edad y [place] 70 aos de edad deben hacerse esta prueba de deteccin. ? El mdico puede recomendarle las pruebas de deteccin a partir de los 12 aos de edad si corre un mayor riesgo. ? Le realizarn pruebas cada 1 a 10 aos, segn los [place] y el tipo de prueba de Programme researcher, broadcasting/film/video.  Pruebas de deteccin de la diabetes. ? Esto se Set designer un control del azcar en la sangre (glucosa) despus de no haber comido durante un periodo de tiempo (ayuno). ? Es posible que se le realice esta prueba cada 1 a 3 aos.  Mamografa. ? Se puede realizar cada 1 o 2 aos. ? Hable con su mdico sobre cundo debe comenzar a realizarse mamografas de [place] regular. Esto depende de si tiene antecedentes familiares de cncer de mama o no.  Pruebas de deteccin de cncer relacionado con las mutaciones del BRCA. Es posible que se las deba realizar si tiene antecedentes de cncer de mama, de ovario, de trompas o peritoneal.  Examen plvico y prueba de Papanicolaou. ? Esto se Optometrist  cada 3aos a [employer] 21aos de edad. ? A partir de los 30 aos, esto se puede Optometrist cada 5 aos si usted se realiza una prueba de Papanicolaou en combinacin con una prueba de deteccin del virus del papiloma humano (VPH). Otras pruebas  Pruebas de enfermedades de transmisin sexual (ETS), si est en riesgo.  Densitometra sea. Esto se realiza para detectar osteoporosis. Se le puede realizar  este examen de deteccin si tiene un riesgo alto de tener osteoporosis. Hable con su mdico [employer], las opciones de tratamiento y, si corresponde, la necesidad de Optometrist ms pruebas. Siga estas instrucciones en su casa: Comida y bebida  Siga una dieta que incluya frutas y verduras frescas, cereales integrales, protenas magras y productos lcteos descremados.  Tome los suplementos vitamnicos y [employer] se lo haya indicado el mdico.  No beba alcohol si: ? Su mdico le indica no hacerlo. ? Est embarazada, puede estar embarazada o est tratando de quedar embarazada.  Si bebe alcohol: ? Limite la cantidad que consume de 0 a 1 medida por da. ? Est atenta a la cantidad de alcohol que hay en las bebidas que toma. En los [place], una medida equivale a una botella de cerveza de 12oz (349ml), un vaso de vino de 5oz (164ml) o un vaso de una bebida alcohlica de alta graduacin de 1oz (27ml).   Estilo de [employer] y las encas a diario. Cepllese los dientes a la maana y a la noche con pasta dental con fluoruro. Use hilo dental una vez al da.  Mantngase activa. Haga al menos 30 minutos de ejercicio, 5 o ms das cada semana.  No consuma ningn producto que contenga nicotina o tabaco, como cigarrillos, cigarrillos electrnicos y tabaco de Higher education careers adviser. Si necesita ayuda para dejar de fumar, consulte al mdico.  No consuma drogas.  Si es sexualmente activa, practique sexo seguro. Use un condn u otra forma de proteccin para prevenir las ITS (infecciones de transmisin sexual).  Si no desea quedar embarazada, use un mtodo anticonceptivo. Si busca un embarazo, realice una consulta previa al [employer] con el mdico.  Si el mdico se lo indic, tome una dosis baja de aspirina diariamente a partir de los 35 aos de [place].  Encuentre formas saludables de lidiar con el estrs tales como: ? Meditacin, yoga o escuchar msica. ? Lleve un  diario personal. ? Hable con una persona confiable. ? Pase tiempo con amigos y familiares. Seguridad  [place] siempre el cinturn de seguridad al conducir o viajar en un vehculo.  No conduzca: ? Si ha estado bebiendo alcohol. No viaje con un conductor que ha estado bebiendo. ? Si est cansada o distrada. ? Mientras est enviando mensajes de texto.  Use un casco y otros equipos de proteccin [place] deportivas.  Si tiene armas de fuego en su casa, asegrese de seguir todos los procedimientos de seguridad correspondientes. Cundo volver?  Visite al mdico una vez al ao para una visita anual de control de bienestar.  Pregntele al mdico con qu frecuencia debe realizarse un control de la vista y los dientes.  Mantenga su esquema de vacunacin al da. Esta informacin no tiene Marine scientist el consejo del mdico. Asegrese de hacerle al mdico cualquier pregunta que tenga. Document Revised: 02/24/2020 Document Reviewed: 02/24/2020 Elsevier Patient Education  2021 [employer].

## 2020-06-17 NOTE — Addendum Note (Signed)
Addended by: Lerry Liner on: 06/17/2020 07:41 AM   Modules accepted: Orders

## 2020-06-22 ENCOUNTER — Other Ambulatory Visit: Payer: Self-pay | Admitting: Internal Medicine

## 2020-06-22 DIAGNOSIS — E559 Vitamin D deficiency, unspecified: Secondary | ICD-10-CM

## 2020-07-27 ENCOUNTER — Encounter: Payer: Self-pay | Admitting: Internal Medicine

## 2020-07-27 ENCOUNTER — Other Ambulatory Visit: Payer: Self-pay

## 2020-07-27 ENCOUNTER — Ambulatory Visit: Payer: 59 | Admitting: Internal Medicine

## 2020-07-27 VITALS — BP 98/64 | HR 75 | Temp 98.2°F | Wt 150.9 lb

## 2020-07-27 DIAGNOSIS — M79604 Pain in right leg: Secondary | ICD-10-CM

## 2020-07-27 NOTE — Progress Notes (Signed)
Established Patient Office Visit     This visit occurred during the SARS-CoV-2 public health emergency.  Safety protocols were in place, including screening questions prior to the visit, additional usage of staff PPE, and extensive cleaning of exam room while observing appropriate contact time as indicated for disinfecting solutions.    CC/Reason for Visit: Right leg pain  HPI: Samantha Gardner is a 45 y.o. female who is coming in today for the above mentioned reasons. Past Medical History is significant for: GERD, iron deficiency anemia and vitamin D deficiency.  He is here today for right leg pain.  She is not able to exactly localize it but tells me that is usually in the calf but sometimes on the outside of her leg.  She does not describe any swelling, she is not very physically active, she does spend a lot of time standing doing dishes and cooking at home.  She is not known to have any significant venous insufficiency.  She has no numbness.   Past Medical/Surgical History: Past Medical History:  Diagnosis Date  . GERD (gastroesophageal reflux disease)     Past Surgical History:  Procedure Laterality Date  . CESAREAN SECTION    . hysterectomy     In [place] Dec 2021    Social History:  reports that she has never smoked. She has never used smokeless tobacco. She reports that she does not drink alcohol and does not use drugs.  Allergies: No Known Allergies  Family History:  No history of heart disease, cancer, stroke that she is aware of  Current Outpatient Medications:  .  ferrous sulfate 325 (65 FE) MG EC tablet, Take 1 tablet by mouth in the morning and at bedtime., Disp: , Rfl:  .  pantoprazole (PROTONIX) 40 MG tablet, TAKE 1 TABLET(40 MG) BY MOUTH DAILY, Disp: 90 tablet, Rfl: 1 .  Vitamin D, Ergocalciferol, (DRISDOL) 1.25 MG (50000 UNIT) CAPS capsule, Take 1 capsule (50,000 Units total) by mouth every 7 (seven) days for 12 doses., Disp: 12 capsule, Rfl:  0  Review of Systems:  Constitutional: Denies fever, chills, diaphoresis, appetite change and fatigue.  HEENT: Denies photophobia, eye pain, redness, hearing loss, ear pain, congestion, sore throat, rhinorrhea, sneezing, mouth sores, trouble swallowing, neck pain, neck stiffness and tinnitus.   Respiratory: Denies SOB, DOE, cough, chest tightness,  and wheezing.   Cardiovascular: Denies chest pain, palpitations and leg swelling.  Gastrointestinal: Denies nausea, vomiting, abdominal pain, diarrhea, constipation, blood in stool and abdominal distention.  Genitourinary: Denies dysuria, urgency, frequency, hematuria, flank pain and difficulty urinating.  Endocrine: Denies: hot or cold intolerance, sweats, changes in hair or nails, polyuria, polydipsia. Musculoskeletal: Deniesback pain, joint swelling, arthralgias and gait problem.  Skin: Denies pallor, rash and wound.  Neurological: Denies dizziness, seizures, syncope, weakness, light-headedness, numbness and headaches.  Hematological: Denies adenopathy. Easy bruising, personal or family bleeding history  Psychiatric/Behavioral: Denies suicidal ideation, mood changes, confusion, nervousness, sleep disturbance and agitation    Physical Exam: Vitals:   07/27/20 1429  BP: 98/64  Pulse: 75  Temp: 98.2 F (36.8 C)  TempSrc: Oral  SpO2: 98%  Weight: 150 lb 14.4 oz (68.4 kg)    Body mass index is 25.9 kg/m.   Constitutional: NAD, calm, comfortable Eyes: PERRL, lids and conjunctivae normal ENMT: Mucous membranes are moist.  Musculoskeletal: no clubbing / cyanosis. No joint deformity upper and lower extremities. Good ROM, no contractures. Normal muscle tone.  On visual inspection right leg is not edematous  or erythematous.  There is no pain to palpation.  No is no evident Baker's cyst or knee effusion. Neurologic: Grossly intact and nonfocal. Psychiatric: Normal judgment and insight. Alert and oriented x 3. Normal mood.    Impression and  Plan:  Right leg pain -Etiology remains unclear. -She has good pulses, has no numbness, has no edema. -I believe okay to observe, use conservative measures like NSAIDs and hot water soaks.    Chaya Jan, MD [facility] at [facility]

## 2020-08-08 ENCOUNTER — Ambulatory Visit: Payer: 59

## 2020-09-08 ENCOUNTER — Other Ambulatory Visit: Payer: Self-pay | Admitting: Internal Medicine

## 2020-09-08 DIAGNOSIS — E559 Vitamin D deficiency, unspecified: Secondary | ICD-10-CM

## 2020-09-27 ENCOUNTER — Other Ambulatory Visit: Payer: Self-pay

## 2020-09-27 ENCOUNTER — Ambulatory Visit
Admission: RE | Admit: 2020-09-27 | Discharge: 2020-09-27 | Disposition: A | Discharge status: Home / Self Care | Payer: 59 | Source: Ambulatory Visit | Attending: Internal Medicine | Admitting: Internal Medicine

## 2020-09-27 DIAGNOSIS — Z1239 Encounter for other screening for malignant neoplasm of breast: Secondary | ICD-10-CM

## 2020-09-30 ENCOUNTER — Other Ambulatory Visit: Payer: Self-pay | Admitting: Internal Medicine

## 2020-09-30 DIAGNOSIS — R928 Other abnormal and inconclusive findings on diagnostic imaging of breast: Secondary | ICD-10-CM

## 2020-10-09 ENCOUNTER — Other Ambulatory Visit: Payer: Self-pay | Admitting: Internal Medicine

## 2020-10-09 DIAGNOSIS — K219 Gastro-esophageal reflux disease without esophagitis: Secondary | ICD-10-CM

## 2020-10-25 ENCOUNTER — Ambulatory Visit
Admission: RE | Admit: 2020-10-25 | Discharge: 2020-10-25 | Disposition: A | Discharge status: Home / Self Care | Payer: 59 | Source: Ambulatory Visit | Attending: Internal Medicine | Admitting: Internal Medicine

## 2020-10-25 ENCOUNTER — Other Ambulatory Visit: Payer: Self-pay

## 2020-10-25 DIAGNOSIS — R928 Other abnormal and inconclusive findings on diagnostic imaging of breast: Secondary | ICD-10-CM

## 2021-01-02 ENCOUNTER — Telehealth: Payer: Self-pay

## 2021-01-02 NOTE — Telephone Encounter (Signed)
Samantha Gardner  [facility] Triage  Patient had a vaginal bump that burst this morning she had some bleeding from it but it has stopped. She no longer has pain but wants to know if she needs to come in. Please advise. thx

## 2021-01-02 NOTE — Telephone Encounter (Signed)
Tiffany's reply was routed to [place] so that she can inform patient.

## 2021-01-02 NOTE — Telephone Encounter (Signed)
Samantha Gardner said she called patient and per DPR access note on file she left detailed message in voice mail with TW's reply.

## 2021-01-02 NOTE — Telephone Encounter (Signed)
Since it has drained and the pain has subsided she does not need to be seen. If pain returns or she begins to have swelling, warmth, or redness then she needs to be seen.

## 2021-03-07 ENCOUNTER — Ambulatory Visit (INDEPENDENT_AMBULATORY_CARE_PROVIDER_SITE_OTHER): Payer: 59 | Admitting: Nurse Practitioner

## 2021-03-07 ENCOUNTER — Encounter: Payer: Self-pay | Admitting: Nurse Practitioner

## 2021-03-07 ENCOUNTER — Other Ambulatory Visit: Payer: Self-pay

## 2021-03-07 VITALS — BP 116/74 | Ht 64.0 in | Wt 155.0 lb

## 2021-03-07 DIAGNOSIS — Z01419 Encounter for gynecological examination (general) (routine) without abnormal findings: Secondary | ICD-10-CM | POA: Diagnosis not present

## 2021-03-07 NOTE — Progress Notes (Signed)
   Samantha Gardner 1976-04-23 [id number]   History:  45 y.o. G3P3003 presents for annual exam without GYN complaints. 04/2020 hysterectomy in [place] for endometriosis and fibroids. Normal pap history.   Gynecologic History Patient's last menstrual period was 04/17/2020.   Contraception/Family planning: status post hysterectomy Sexually active: Yes  Health Maintenance Last Pap: 09/13/2016. Results were: Normal Last mammogram: 09/27/2020. Results were: Left breast asymmetry, benign follow up ultrasound Last colonoscopy: Not indicated Last Dexa: Not indicated  Past medical history, past surgical history, family history and social history were all reviewed and documented in the EPIC chart. Married. 3 children ages 58, 54, and 34. SAHM.   ROS:  A ROS was performed and pertinent positives and negatives are included.  Exam:  Vitals:   03/07/21 0850  BP: 116/74  Weight: 155 lb (70.3 kg)  Height: 5\' 4"  (1.626 m)   Body mass index is 26.61 kg/m.  General appearance:  Normal Thyroid:  Symmetrical, normal in size, without palpable masses or nodularity. Respiratory  Auscultation:  Clear without wheezing or rhonchi Cardiovascular  Auscultation:  Regular rate, without rubs, murmurs or gallops  Edema/varicosities:  Not grossly evident Abdominal  Soft,nontender, without masses, guarding or rebound.  Liver/spleen:  No organomegaly noted  Hernia:  None appreciated  Skin  Inspection:  Grossly normal Breasts: Examined lying and sitting.   Right: Without masses, retractions, nipple discharge or axillary adenopathy.   Left: Without masses, retractions, nipple discharge or axillary adenopathy. Genitourinary   Inguinal/mons:  Normal without inguinal adenopathy  External genitalia:  Normal appearing vulva with no masses, tenderness, or lesions  BUS/Urethra/Skene's glands:  Normal  Vagina:  Normal appearing with normal color and discharge, no lesions  Cervix:  Absent  Uterus:   Absent  Adnexa/parametria:     Rt: Normal in size, without masses or tenderness.   Lt: Normal in size, without masses or tenderness.  Anus and perineum: Normal  Patient informed chaperone available to be present for breast and pelvic exam. Patient has requested no chaperone to be present. Patient has been advised what will be completed during breast and pelvic exam.   Assessment/Plan:  45 y.o. G3P3003 for annual exam.   Well female exam with routine gynecological exam - Education provided on SBEs, importance of preventative screenings, current guidelines, high calcium diet, regular exercise, and multivitamin daily. Labs with PCP.  Screening for cervical cancer - Normal Pap history. No longer screening per guidelines.   Screening for breast cancer - Mammogram 09/27/2020 showed left breast asymmetry, benign follow up ultrasound. Continue annual screenings.  Normal breast exam today.  Screening for colon cancer - Average risk. Will plan to start screenings at age 45.   Return in 1 year for annual.   54 DNP, 9:03 AM 03/07/2021

## 2021-05-11 ENCOUNTER — Other Ambulatory Visit: Payer: Self-pay | Admitting: Internal Medicine

## 2021-05-11 DIAGNOSIS — K219 Gastro-esophageal reflux disease without esophagitis: Secondary | ICD-10-CM

## 2021-09-21 ENCOUNTER — Other Ambulatory Visit: Payer: Self-pay | Admitting: Internal Medicine

## 2021-11-01 ENCOUNTER — Encounter: Payer: Self-pay | Admitting: Internal Medicine

## 2021-11-29 ENCOUNTER — Encounter: Payer: Self-pay | Admitting: Internal Medicine

## 2021-11-29 ENCOUNTER — Other Ambulatory Visit: Payer: Self-pay | Admitting: Internal Medicine

## 2021-11-29 ENCOUNTER — Ambulatory Visit (INDEPENDENT_AMBULATORY_CARE_PROVIDER_SITE_OTHER): Payer: 59 | Admitting: Internal Medicine

## 2021-11-29 VITALS — BP 102/68 | HR 88 | Temp 98.2°F | Ht 64.5 in | Wt 147.8 lb

## 2021-11-29 DIAGNOSIS — Z0000 Encounter for general adult medical examination without abnormal findings: Secondary | ICD-10-CM

## 2021-11-29 DIAGNOSIS — R7302 Impaired glucose tolerance (oral): Secondary | ICD-10-CM | POA: Insufficient documentation

## 2021-11-29 DIAGNOSIS — Z1231 Encounter for screening mammogram for malignant neoplasm of breast: Secondary | ICD-10-CM | POA: Diagnosis not present

## 2021-11-29 DIAGNOSIS — E559 Vitamin D deficiency, unspecified: Secondary | ICD-10-CM

## 2021-11-29 DIAGNOSIS — H539 Unspecified visual disturbance: Secondary | ICD-10-CM

## 2021-11-29 LAB — CBC WITH DIFFERENTIAL/PLATELET
Basophils Absolute: 0 10*3/uL (ref 0.0–0.1)
Basophils Relative: 0.7 % (ref 0.0–3.0)
Eosinophils Absolute: 0.2 10*3/uL (ref 0.0–0.7)
Eosinophils Relative: 2.6 % (ref 0.0–5.0)
HCT: 41.7 % (ref 36.0–46.0)
Hemoglobin: 13.6 g/dL (ref 12.0–15.0)
Lymphocytes Relative: 26.3 % (ref 12.0–46.0)
Lymphs Abs: 1.8 10*3/uL (ref 0.7–4.0)
MCHC: 32.6 g/dL (ref 30.0–36.0)
MCV: 80.4 fl (ref 78.0–100.0)
Monocytes Absolute: 0.5 10*3/uL (ref 0.1–1.0)
Monocytes Relative: 7 % (ref 3.0–12.0)
Neutro Abs: 4.4 10*3/uL (ref 1.4–7.7)
Neutrophils Relative %: 63.4 % (ref 43.0–77.0)
Platelets: 192 10*3/uL (ref 150.0–400.0)
RBC: 5.19 Mil/uL — ABNORMAL HIGH (ref 3.87–5.11)
RDW: 14.1 % (ref 11.5–15.5)
WBC: 6.9 10*3/uL (ref 4.0–10.5)

## 2021-11-29 LAB — LIPID PANEL
Cholesterol: 169 mg/dL (ref 0–200)
HDL: 49.9 mg/dL (ref >39.00)
LDL Cholesterol: 89 mg/dL (ref 0–99)
NonHDL: 119.44
Total CHOL/HDL Ratio: 3
Triglycerides: 153 mg/dL — ABNORMAL HIGH (ref 0.0–149.0)
VLDL: 30.6 mg/dL (ref 0.0–40.0)

## 2021-11-29 LAB — COMPREHENSIVE METABOLIC PANEL
ALT: 14 U/L (ref 0–35)
AST: 15 U/L (ref 0–37)
Albumin: 4.1 g/dL (ref 3.5–5.2)
Alkaline Phosphatase: 35 U/L — ABNORMAL LOW (ref 39–117)
BUN: 12 mg/dL (ref 6–23)
CO2: 27 mEq/L (ref 19–32)
Calcium: 9 mg/dL (ref 8.4–10.5)
Chloride: 105 mEq/L (ref 96–112)
Creatinine, Ser: 0.66 mg/dL (ref 0.40–1.20)
GFR: 106.96 mL/min (ref >60.00)
Glucose, Bld: 94 mg/dL (ref 70–99)
Potassium: 3.9 mEq/L (ref 3.5–5.1)
Sodium: 137 mEq/L (ref 135–145)
Total Bilirubin: 0.4 mg/dL (ref 0.2–1.2)
Total Protein: 7.3 g/dL (ref 6.0–8.3)

## 2021-11-29 LAB — VITAMIN B12: Vitamin B-12: 286 pg/mL (ref 211–911)

## 2021-11-29 LAB — VITAMIN D 25 HYDROXY (VIT D DEFICIENCY, FRACTURES): VITD: 11.84 ng/mL — ABNORMAL LOW (ref 30.00–100.00)

## 2021-11-29 LAB — TSH: TSH: 2.42 u[IU]/mL (ref 0.35–5.50)

## 2021-11-29 LAB — HEMOGLOBIN A1C: Hgb A1c MFr Bld: 6.1 % (ref 4.6–6.5)

## 2021-11-29 MED ORDER — VITAMIN D (ERGOCALCIFEROL) 1.25 MG (50000 UNIT) PO CAPS: 50000.0000 [IU] | ORAL_CAPSULE | ORAL | 0 refills | Status: AC

## 2021-11-29 NOTE — Progress Notes (Signed)
Established Patient Office Visit     CC/Reason for Visit: Annual preventive exam  HPI: Samantha Gardner is a 46 y.o. female who is coming in today for the above mentioned reasons. Past Medical History is significant for: Vitamin D deficiency.  She is doing well and has no acute concerns or complaints.  She saw GYN in November and was told she no longer needed Pap smears due to her having had a hysterectomy.  She is now due for mammogram.  She is also due for COVID vaccination.  She has not had an eye exam.   Past Medical/Surgical History: Past Medical History:  Diagnosis Date   GERD (gastroesophageal reflux disease)     Past Surgical History:  Procedure Laterality Date   CESAREAN SECTION     hysterectomy     In [place] Dec 2021    Social History:  reports that she has never smoked. She has never used smokeless tobacco. She reports that she does not drink alcohol and does not use drugs.  Allergies: No Known Allergies  Family History:  Family History  Problem Relation Age of Onset   Endometriosis Sister    Fibroids Sister      Current Outpatient Medications:    ferrous sulfate 325 (65 FE) MG EC tablet, Take 1 tablet by mouth in the morning and at bedtime. (Patient not taking: Reported on 11/29/2021), Disp: , Rfl:    pantoprazole (PROTONIX) 40 MG tablet, TAKE 1 TABLET(40 MG) BY MOUTH DAILY (Patient not taking: Reported on 11/29/2021), Disp: 90 tablet, Rfl: 1  Review of Systems:  Constitutional: Denies fever, chills, diaphoresis, appetite change and fatigue.  HEENT: Denies photophobia, eye pain, redness, hearing loss, ear pain, congestion, sore throat, rhinorrhea, sneezing, mouth sores, trouble swallowing, neck pain, neck stiffness and tinnitus.   Respiratory: Denies SOB, DOE, cough, chest tightness,  and wheezing.   Cardiovascular: Denies chest pain, palpitations and leg swelling.  Gastrointestinal: Denies nausea, vomiting, abdominal pain, diarrhea,  constipation, blood in stool and abdominal distention.  Genitourinary: Denies dysuria, urgency, frequency, hematuria, flank pain and difficulty urinating.  Endocrine: Denies: hot or cold intolerance, sweats, changes in hair or nails, polyuria, polydipsia. Musculoskeletal: Denies myalgias, back pain, joint swelling, arthralgias and gait problem.  Skin: Denies pallor, rash and wound.  Neurological: Denies dizziness, seizures, syncope, weakness, light-headedness, numbness and headaches.  Hematological: Denies adenopathy. Easy bruising, personal or family bleeding history  Psychiatric/Behavioral: Denies suicidal ideation, mood changes, confusion, nervousness, sleep disturbance and agitation    Physical Exam: Vitals:   11/29/21 0825  BP: 102/68  Pulse: 88  Temp: 98.2 F (36.8 C)  TempSrc: Oral  SpO2: 99%  Weight: 147 lb 12.8 oz (67 kg)  Height: 5' 4.5" (1.638 m)    Body mass index is 24.98 kg/m.   Constitutional: NAD, calm, comfortable Eyes: PERRL, lids and conjunctivae normal ENMT: Mucous membranes are moist. Posterior pharynx clear of any exudate or lesions. Normal dentition. Tympanic membrane is pearly white, no erythema or bulging. Neck: normal, supple, no masses, no thyromegaly Respiratory: clear to auscultation bilaterally, no wheezing, no crackles. Normal respiratory effort. No accessory muscle use.  Cardiovascular: Regular rate and rhythm, no murmurs / rubs / gallops. No extremity edema. 2+ pedal pulses. No carotid bruits.  Abdomen: no tenderness, no masses palpated. No hepatosplenomegaly. Bowel sounds positive.  Musculoskeletal: no clubbing / cyanosis. No joint deformity upper and lower extremities. Good ROM, no contractures. Normal muscle tone.  Skin: no rashes, lesions, ulcers. No induration Neurologic:  CN 2-12 grossly intact. Sensation intact, DTR normal. Strength 5/5 in all 4.  Psychiatric: Normal judgment and insight. Alert and oriented x 3. Normal mood.   Flowsheet  Row Office Visit from 11/29/2021 in [facility]  PHQ-9 Total Score 0         Impression and Plan:  Encounter for preventive health examination  - Plan: MM Digital Screening, CBC with Differential/Platelet, Comprehensive metabolic panel, Hemoglobin A1c, Lipid panel, TSH, Vitamin B12 -Recommend routine eye and dental care. -Immunizations: Advised bivalent COVID-vaccine, she had a Tdap in 2020. -Healthy lifestyle discussed in detail. -Labs to be updated today. -Colon cancer screening: Commence age 24 -Breast cancer screening: 10/2020, referral placed -Cervical cancer screening: Per GYN no further needed -Lung cancer screening: Not applicable -Prostate cancer screening: Not applicable -DEXA: Not applicable  Vitamin D deficiency  - Plan: VITAMIN D 25 Hydroxy (Vit-D Deficiency, Fractures)     Patient Instructions  -Nice seeing you today!!  -Lab work today; will notify you once results are available.  -Remember you bivalent COVID vaccine.  -Schedule follow up in 1 year or sooner as needed.      Chaya Jan, MD [facility] at [facility]

## 2021-11-29 NOTE — Patient Instructions (Signed)
-  Nice seeing you today!!  -Lab work today; will notify you once results are available.  -Remember you bivalent COVID vaccine.  -Schedule follow up in 1 year or sooner as needed.

## 2021-11-30 ENCOUNTER — Other Ambulatory Visit: Payer: Self-pay | Admitting: *Deleted

## 2021-11-30 DIAGNOSIS — R7303 Prediabetes: Secondary | ICD-10-CM

## 2021-11-30 DIAGNOSIS — E559 Vitamin D deficiency, unspecified: Secondary | ICD-10-CM

## 2021-12-05 ENCOUNTER — Other Ambulatory Visit: Payer: Self-pay

## 2021-12-05 DIAGNOSIS — E559 Vitamin D deficiency, unspecified: Secondary | ICD-10-CM

## 2022-02-26 ENCOUNTER — Other Ambulatory Visit: Payer: Self-pay | Admitting: Internal Medicine

## 2022-02-26 DIAGNOSIS — E559 Vitamin D deficiency, unspecified: Secondary | ICD-10-CM

## 2022-03-07 ENCOUNTER — Other Ambulatory Visit (INDEPENDENT_AMBULATORY_CARE_PROVIDER_SITE_OTHER): Payer: Commercial Managed Care - HMO

## 2022-03-07 DIAGNOSIS — E559 Vitamin D deficiency, unspecified: Secondary | ICD-10-CM

## 2022-03-07 LAB — VITAMIN D 25 HYDROXY (VIT D DEFICIENCY, FRACTURES): VITD: 32.7 ng/mL (ref 30.00–100.00)

## 2022-03-08 ENCOUNTER — Ambulatory Visit: Payer: 59 | Admitting: Nurse Practitioner

## 2022-03-08 DIAGNOSIS — Z0289 Encounter for other administrative examinations: Secondary | ICD-10-CM

## 2022-03-08 NOTE — Progress Notes (Deleted)
   Samantha Gardner 1976/03/09 [id number]   History:  [name] presents for annual exam without GYN complaints. S/P 04/2020 hysterectomy in [place] for endometriosis and fibroids. Normal pap history.   Gynecologic History Patient's last menstrual period was 04/17/2020.   Contraception/Family planning: status post hysterectomy Sexually active: Yes  Health Maintenance Last Pap: 09/13/2016. Results were: Normal Last mammogram: 09/27/2020. Results were: Left breast asymmetry, benign follow up ultrasound Last colonoscopy: Not indicated Last Dexa: Not indicated  Past medical history, past surgical history, family history and social history were all reviewed and documented in the EPIC chart. Married. 3 children ages 24, 67, and 44. SAHM.   ROS:  A ROS was performed and pertinent positives and negatives are included.  Exam:  There were no vitals filed for this visit.  There is no height or weight on file to calculate BMI.  General appearance:  Normal Thyroid:  Symmetrical, normal in size, without palpable masses or nodularity. Respiratory  Auscultation:  Clear without wheezing or rhonchi Cardiovascular  Auscultation:  Regular rate, without rubs, murmurs or gallops  Edema/varicosities:  Not grossly evident Abdominal  Soft,nontender, without masses, guarding or rebound.  Liver/spleen:  No organomegaly noted  Hernia:  None appreciated  Skin  Inspection:  Grossly normal Breasts: Examined lying and sitting.   Right: Without masses, retractions, nipple discharge or axillary adenopathy.   Left: Without masses, retractions, nipple discharge or axillary adenopathy. Genitourinary   Inguinal/mons:  Normal without inguinal adenopathy  External genitalia:  Normal appearing vulva with no masses, tenderness, or lesions  BUS/Urethra/Skene's glands:  Normal  Vagina:  Normal appearing with normal color and discharge, no lesions  Cervix:  Absent  Uterus:   Absent  Adnexa/parametria:     Rt: Normal in size, without masses or tenderness.   Lt: Normal in size, without masses or tenderness.  Anus and perineum: Normal  Patient informed chaperone available to be present for breast and pelvic exam. Patient has requested no chaperone to be present. Patient has been advised what will be completed during breast and pelvic exam.   Assessment/Plan:  [name] for annual exam.   Well female exam with routine gynecological exam - Education provided on SBEs, importance of preventative screenings, current guidelines, high calcium diet, regular exercise, and multivitamin daily. Labs with PCP.  Screening for cervical cancer - Normal Pap history. No longer screening per guidelines.   Screening for breast cancer - Mammogram 09/27/2020 showed left breast asymmetry, benign follow up ultrasound. Continue annual screenings.  Normal breast exam today.  Screening for colon cancer - Average risk. Will plan to start screenings at age 5.   Return in 1 year for annual.     Tamela Gammon DNP, 8:09 AM 03/08/2022

## 2022-05-09 ENCOUNTER — Encounter: Payer: Self-pay | Admitting: Internal Medicine

## 2022-05-09 ENCOUNTER — Ambulatory Visit (INDEPENDENT_AMBULATORY_CARE_PROVIDER_SITE_OTHER): Payer: 59 | Admitting: Internal Medicine

## 2022-05-09 VITALS — BP 102/64 | HR 66 | Temp 98.2°F | Wt 150.0 lb

## 2022-05-09 DIAGNOSIS — R7302 Impaired glucose tolerance (oral): Secondary | ICD-10-CM

## 2022-05-09 DIAGNOSIS — Z23 Encounter for immunization: Secondary | ICD-10-CM

## 2022-05-09 DIAGNOSIS — Z1231 Encounter for screening mammogram for malignant neoplasm of breast: Secondary | ICD-10-CM | POA: Diagnosis not present

## 2022-05-09 DIAGNOSIS — E559 Vitamin D deficiency, unspecified: Secondary | ICD-10-CM | POA: Diagnosis not present

## 2022-05-09 LAB — POCT GLYCOSYLATED HEMOGLOBIN (HGB A1C): Hemoglobin A1C: 5.9 % — AB (ref 4.0–5.6)

## 2022-05-09 NOTE — Progress Notes (Signed)
Established Patient Office Visit     CC/Reason for Visit: 22-month follow-up chronic medical conditions  HPI: Samantha Gardner is a 47 y.o. female who is coming in today for the above mentioned reasons. Past Medical History is significant for: Vitamin D deficiency, impaired glucose tolerance.  She has been feeling well.  She is overdue for her flu vaccine.  She is overdue for screening mammogram.  She will be due for her first colonoscopy over the summer.   Past Medical/Surgical History: Past Medical History:  Diagnosis Date   GERD (gastroesophageal reflux disease)     Past Surgical History:  Procedure Laterality Date   CESAREAN SECTION     hysterectomy     In [place] Dec 2021    Social History:  reports that she has never smoked. She has never used smokeless tobacco. She reports that she does not drink alcohol and does not use drugs.  Allergies: No Known Allergies  Family History:  Family History  Problem Relation Age of Onset   Endometriosis Sister    Fibroids Sister      Current Outpatient Medications:    Cholecalciferol (VITAMIN D3) 50 MCG (2000 UT) capsule, Take 2,000 Units by mouth daily., Disp: , Rfl:    ferrous sulfate 325 (65 FE) MG EC tablet, Take 1 tablet by mouth in the morning and at bedtime., Disp: , Rfl:    pantoprazole (PROTONIX) 40 MG tablet, TAKE 1 TABLET(40 MG) BY MOUTH DAILY, Disp: 90 tablet, Rfl: 1  Review of Systems:  Constitutional: Denies fever, chills, diaphoresis, appetite change and fatigue.  HEENT: Denies photophobia, eye pain, redness, hearing loss, ear pain, congestion, sore throat, rhinorrhea, sneezing, mouth sores, trouble swallowing, neck pain, neck stiffness and tinnitus.   Respiratory: Denies SOB, DOE, cough, chest tightness,  and wheezing.   Cardiovascular: Denies chest pain, palpitations and leg swelling.  Gastrointestinal: Denies nausea, vomiting, abdominal pain, diarrhea, constipation, blood in stool and abdominal  distention.  Genitourinary: Denies dysuria, urgency, frequency, hematuria, flank pain and difficulty urinating.  Endocrine: Denies: hot or cold intolerance, sweats, changes in hair or nails, polyuria, polydipsia. Musculoskeletal: Denies myalgias, back pain, joint swelling, arthralgias and gait problem.  Skin: Denies pallor, rash and wound.  Neurological: Denies dizziness, seizures, syncope, weakness, light-headedness, numbness and headaches.  Hematological: Denies adenopathy. Easy bruising, personal or family bleeding history  Psychiatric/Behavioral: Denies suicidal ideation, mood changes, confusion, nervousness, sleep disturbance and agitation    Physical Exam: Vitals:   05/09/22 0707  BP: 102/64  Pulse: 66  Temp: 98.2 F (36.8 C)  TempSrc: Oral  SpO2: 98%  Weight: 150 lb (68 kg)    Body mass index is 25.35 kg/m.   Constitutional: NAD, calm, comfortable Eyes: PERRL, lids and conjunctivae normal ENMT: Mucous membranes are moist.  Respiratory: clear to auscultation bilaterally, no wheezing, no crackles. Normal respiratory effort. No accessory muscle use.  Cardiovascular: Regular rate and rhythm, no murmurs / rubs / gallops. No extremity edema.   Psychiatric: Normal judgment and insight. Alert and oriented x 3. Normal mood.    Impression and Plan:  IGT (impaired glucose tolerance) - Plan: POCT glycosylated hemoglobin (Hb A1C)  Needs flu shot - Plan: Flu Vaccine QUAD 6+ mos PF IM (Fluarix Quad PF)  Encounter for screening mammogram for malignant neoplasm of breast - Plan: MM Digital Screening  Vitamin D deficiency  -A1c of 5.9 remains in the prediabetic range but stable. -She is overdue for mammogram, will request today. -She is taking 2000 IU  of vitamin D daily, recheck vitamin D levels when she returns for CPE. -She is well overdue for screening mammogram, referral placed.  Time spent:32 minutes reviewing chart, interviewing and examining patient and formulating plan  of care.       Lelon Frohlich, MD [facility] at [facility]

## 2022-05-23 ENCOUNTER — Ambulatory Visit
Admission: RE | Admit: 2022-05-23 | Discharge: 2022-05-23 | Disposition: A | Discharge status: Home / Self Care | Payer: PRIVATE HEALTH INSURANCE | Source: Ambulatory Visit | Attending: Internal Medicine | Admitting: Internal Medicine

## 2022-05-23 DIAGNOSIS — Z1231 Encounter for screening mammogram for malignant neoplasm of breast: Secondary | ICD-10-CM

## 2022-05-25 ENCOUNTER — Other Ambulatory Visit: Payer: Self-pay | Admitting: Internal Medicine

## 2022-05-25 ENCOUNTER — Telehealth: Payer: Self-pay | Admitting: Internal Medicine

## 2022-05-25 ENCOUNTER — Encounter: Payer: Self-pay | Admitting: Internal Medicine

## 2022-05-25 DIAGNOSIS — R928 Other abnormal and inconclusive findings on diagnostic imaging of breast: Secondary | ICD-10-CM

## 2022-05-25 DIAGNOSIS — Z1231 Encounter for screening mammogram for malignant neoplasm of breast: Secondary | ICD-10-CM

## 2022-05-25 NOTE — Telephone Encounter (Signed)
error 

## 2022-05-25 NOTE — Telephone Encounter (Signed)
Pt states she had a mammo last year and was told she needed to have some xrays done. Pt called to ask MD if she could please create a referral to another mammo and/or xrays that accepts her insurance [employer])  She needs an xray of the right side - as per the last place she had her mammo done.   Please advise.

## 2022-05-28 NOTE — Telephone Encounter (Signed)
Okay to order?

## 2022-05-29 NOTE — Telephone Encounter (Signed)
Pt has [employer]. Pt is asking that MD create another referral for a mammo, where they accept her insurance. Please advise.  [phone]

## 2022-05-29 NOTE — Telephone Encounter (Signed)
Orders placed

## 2022-05-30 NOTE — Telephone Encounter (Signed)
Spoke with patient via [employer].  Patient will call back with more information.

## 2022-05-30 NOTE — Addendum Note (Signed)
Addended by: Westley Hummer B on: 05/30/2022 01:58 PM   Modules accepted: Orders

## 2022-05-31 ENCOUNTER — Other Ambulatory Visit: Payer: PRIVATE HEALTH INSURANCE

## 2022-05-31 ENCOUNTER — Telehealth: Payer: Self-pay | Admitting: Internal Medicine

## 2022-05-31 DIAGNOSIS — Z1239 Encounter for other screening for malignant neoplasm of breast: Secondary | ICD-10-CM

## 2022-05-31 DIAGNOSIS — Z1231 Encounter for screening mammogram for malignant neoplasm of breast: Secondary | ICD-10-CM

## 2022-05-31 NOTE — Telephone Encounter (Signed)
Pt stated the location that she was referred to for her Mammogram doesn't take her INS so she was seeing if she can get referred somewhere else. INS has been updated.   Please advise.

## 2022-05-31 NOTE — Telephone Encounter (Signed)
Spoke with patient and orders placed with [employer] [facility].    Via [employer].

## 2022-05-31 NOTE — Telephone Encounter (Signed)
Spoke with patient and orders placed with [employer] [facility].

## 2022-06-19 ENCOUNTER — Ambulatory Visit
Admission: RE | Admit: 2022-06-19 | Discharge: 2022-06-19 | Disposition: A | Discharge status: Home / Self Care | Payer: PRIVATE HEALTH INSURANCE | Source: Ambulatory Visit | Attending: Internal Medicine | Admitting: Internal Medicine

## 2022-06-19 DIAGNOSIS — R928 Other abnormal and inconclusive findings on diagnostic imaging of breast: Secondary | ICD-10-CM

## 2022-06-20 ENCOUNTER — Other Ambulatory Visit: Payer: Self-pay | Admitting: Internal Medicine

## 2022-06-20 DIAGNOSIS — N6310 Unspecified lump in the right breast, unspecified quadrant: Secondary | ICD-10-CM

## 2022-07-10 ENCOUNTER — Encounter: Payer: Self-pay | Admitting: Internal Medicine

## 2022-07-10 ENCOUNTER — Ambulatory Visit (INDEPENDENT_AMBULATORY_CARE_PROVIDER_SITE_OTHER): Payer: PRIVATE HEALTH INSURANCE | Admitting: Internal Medicine

## 2022-07-10 ENCOUNTER — Telehealth: Payer: Self-pay | Admitting: Internal Medicine

## 2022-07-10 VITALS — BP 110/70 | HR 80 | Temp 98.8°F | Wt 151.0 lb

## 2022-07-10 DIAGNOSIS — N63 Unspecified lump in unspecified breast: Secondary | ICD-10-CM | POA: Diagnosis not present

## 2022-07-10 NOTE — Telephone Encounter (Signed)
Pt husband call and stated this is the place where she want a referral to it is [facility] [address] ,[place] [address] ,[place]

## 2022-07-10 NOTE — Progress Notes (Signed)
     Established Patient Office Visit     CC/Reason for Visit: Explain mammogram results  HPI: Samantha Gardner is a 47 y.o. female who is coming in today for the above mentioned reasons.  She had her screening mammogram in January and was recalled for diagnostic imaging of the rest breast due to a mass.  She was then told she needed a biopsy which I see is ordered but has not been scheduled yet.  She comes to me for me to explain all of this to her.  She is Spanish-speaking mainly, has only very minimal English understanding.  Unfortunately there was not a translator present for her at the time.   Past Medical/Surgical History: Past Medical History:  Diagnosis Date   GERD (gastroesophageal reflux disease)     Past Surgical History:  Procedure Laterality Date   CESAREAN SECTION     hysterectomy     In [place] Dec 2021    Social History:  reports that she has never smoked. She has never used smokeless tobacco. She reports that she does not drink alcohol and does not use drugs.  Allergies: No Known Allergies  Family History:  Family History  Problem Relation Age of Onset   Endometriosis Sister    Fibroids Sister    Breast cancer Neg Hx      Current Outpatient Medications:    Cholecalciferol (VITAMIN D3) 50 MCG (2000 UT) capsule, Take 2,000 Units by mouth daily., Disp: , Rfl:    ferrous sulfate 325 (65 FE) MG EC tablet, Take 1 tablet by mouth in the morning and at bedtime., Disp: , Rfl:    pantoprazole (PROTONIX) 40 MG tablet, TAKE 1 TABLET(40 MG) BY MOUTH DAILY, Disp: 90 tablet, Rfl: 1  Review of Systems:  Negative unless indicated in HPI.   Physical Exam: Vitals:   07/10/22 0953  BP: 110/70  Pulse: 80  Temp: 98.8 F (37.1 C)  TempSrc: Oral  SpO2: 100%  Weight: 151 lb (68.5 kg)    Body mass index is 25.52 kg/m.   Physical Exam Vitals reviewed.  Constitutional:      Appearance: Normal appearance.  HENT:     Head: Normocephalic and  atraumatic.  Eyes:     Conjunctiva/sclera: Conjunctivae normal.  Skin:    General: Skin is warm and dry.  Neurological:     General: No focal deficit present.     Mental Status: She is alert and oriented to person, place, and time.  Psychiatric:        Mood and Affect: Mood normal.        Behavior: Behavior normal.        Thought Content: Thought content normal.        Judgment: Judgment normal.      Impression and Plan:  Mass of breast, unspecified laterality  -Have explained to her the next step would be a biopsy as has been recommended by breast radiologist. -Have advised that she has the right to request an interpreter be present during her biopsy and all future medical visits.   Time spent:22 minutes reviewing chart, interviewing and examining patient and formulating plan of care.     Lelon Frohlich, MD [facility] at [facility]

## 2022-07-10 NOTE — Telephone Encounter (Signed)
Spoke to someone at [facility] ,[address] ,[place] who informed me that they do not do breast bx.  Husband is aware.  Husband will call back.

## 2022-07-11 ENCOUNTER — Ambulatory Visit: Payer: PRIVATE HEALTH INSURANCE | Admitting: Internal Medicine

## 2022-07-17 ENCOUNTER — Encounter (HOSPITAL_COMMUNITY): Payer: Self-pay

## 2022-07-17 ENCOUNTER — Other Ambulatory Visit: Payer: Self-pay | Admitting: Internal Medicine

## 2022-07-17 ENCOUNTER — Ambulatory Visit (HOSPITAL_COMMUNITY)
Admission: RE | Admit: 2022-07-17 | Discharge: 2022-07-17 | Disposition: A | Discharge status: Home / Self Care | Payer: 59 | Source: Ambulatory Visit | Attending: Internal Medicine | Admitting: Internal Medicine

## 2022-07-17 DIAGNOSIS — N6310 Unspecified lump in the right breast, unspecified quadrant: Secondary | ICD-10-CM | POA: Insufficient documentation

## 2022-07-17 MED ORDER — LIDOCAINE HCL (PF) 2 % IJ SOLN
INTRAMUSCULAR | Status: AC
  Administered 2022-07-17: 10 mL via INTRADERMAL
  Filled 2022-07-17: qty 10

## 2022-07-17 MED ORDER — LIDOCAINE-EPINEPHRINE (PF) 1 %-1:200000 IJ SOLN
10.0000 mL | Freq: Once | INTRAMUSCULAR | Status: AC
  Administered 2022-07-17: 10 mL via INTRADERMAL

## 2022-11-07 ENCOUNTER — Ambulatory Visit (INDEPENDENT_AMBULATORY_CARE_PROVIDER_SITE_OTHER): Payer: 59 | Admitting: Internal Medicine

## 2022-11-07 ENCOUNTER — Encounter: Payer: Self-pay | Admitting: Internal Medicine

## 2022-11-07 VITALS — BP 110/68 | HR 70 | Temp 98.2°F | Ht 63.5 in | Wt 153.0 lb

## 2022-11-07 DIAGNOSIS — R7302 Impaired glucose tolerance (oral): Secondary | ICD-10-CM

## 2022-11-07 DIAGNOSIS — Z0000 Encounter for general adult medical examination without abnormal findings: Secondary | ICD-10-CM

## 2022-11-07 DIAGNOSIS — E559 Vitamin D deficiency, unspecified: Secondary | ICD-10-CM | POA: Diagnosis not present

## 2022-11-07 DIAGNOSIS — K219 Gastro-esophageal reflux disease without esophagitis: Secondary | ICD-10-CM

## 2022-11-07 DIAGNOSIS — Z1159 Encounter for screening for other viral diseases: Secondary | ICD-10-CM

## 2022-11-07 LAB — COMPREHENSIVE METABOLIC PANEL
ALT: 12 U/L (ref 0–35)
AST: 14 U/L (ref 0–37)
Albumin: 4.1 g/dL (ref 3.5–5.2)
Alkaline Phosphatase: 35 U/L — ABNORMAL LOW (ref 39–117)
BUN: 11 mg/dL (ref 6–23)
CO2: 30 mEq/L (ref 19–32)
Calcium: 9.5 mg/dL (ref 8.4–10.5)
Chloride: 102 mEq/L (ref 96–112)
Creatinine, Ser: 0.64 mg/dL (ref 0.40–1.20)
GFR: 107.05 mL/min (ref >60.00)
Glucose, Bld: 93 mg/dL (ref 70–99)
Potassium: 4.1 mEq/L (ref 3.5–5.1)
Sodium: 138 mEq/L (ref 135–145)
Total Bilirubin: 0.5 mg/dL (ref 0.2–1.2)
Total Protein: 7.2 g/dL (ref 6.0–8.3)

## 2022-11-07 LAB — CBC WITH DIFFERENTIAL/PLATELET
Basophils Absolute: 0.1 10*3/uL (ref 0.0–0.1)
Basophils Relative: 0.9 % (ref 0.0–3.0)
Eosinophils Absolute: 0.2 10*3/uL (ref 0.0–0.7)
Eosinophils Relative: 2.5 % (ref 0.0–5.0)
HCT: 42.9 % (ref 36.0–46.0)
Hemoglobin: 13.6 g/dL (ref 12.0–15.0)
Lymphocytes Relative: 29.9 % (ref 12.0–46.0)
Lymphs Abs: 2 10*3/uL (ref 0.7–4.0)
MCHC: 31.7 g/dL (ref 30.0–36.0)
MCV: 81.6 fl (ref 78.0–100.0)
Monocytes Absolute: 0.5 10*3/uL (ref 0.1–1.0)
Monocytes Relative: 7.2 % (ref 3.0–12.0)
Neutro Abs: 3.9 10*3/uL (ref 1.4–7.7)
Neutrophils Relative %: 59.5 % (ref 43.0–77.0)
Platelets: 232 10*3/uL (ref 150.0–400.0)
RBC: 5.26 Mil/uL — ABNORMAL HIGH (ref 3.87–5.11)
RDW: 14.4 % (ref 11.5–15.5)
WBC: 6.6 10*3/uL (ref 4.0–10.5)

## 2022-11-07 LAB — LIPID PANEL
Cholesterol: 185 mg/dL (ref 0–200)
HDL: 50.8 mg/dL (ref >39.00)
LDL Cholesterol: 103 mg/dL — ABNORMAL HIGH (ref 0–99)
NonHDL: 134.53
Total CHOL/HDL Ratio: 4
Triglycerides: 158 mg/dL — ABNORMAL HIGH (ref 0.0–149.0)
VLDL: 31.6 mg/dL (ref 0.0–40.0)

## 2022-11-07 LAB — HEMOGLOBIN A1C: Hgb A1c MFr Bld: 5.9 % (ref 4.6–6.5)

## 2022-11-07 LAB — VITAMIN D 25 HYDROXY (VIT D DEFICIENCY, FRACTURES): VITD: 19.74 ng/mL — ABNORMAL LOW (ref 30.00–100.00)

## 2022-11-07 NOTE — Progress Notes (Signed)
Established Patient Office Visit     CC/Reason for Visit: Annual preventive exam  HPI: Samantha Gardner is a 47 y.o. female who is coming in today for the above mentioned reasons. Past Medical History is significant for: Vitamin D deficiency and impaired glucose tolerance.  She is feeling well and has no acute concerns or complaints.  She has routine eye care, is overdue for dental exam.  Cancer screening is up-to-date.   Past Medical/Surgical History: Past Medical History:  Diagnosis Date   GERD (gastroesophageal reflux disease)     Past Surgical History:  Procedure Laterality Date   CESAREAN SECTION     hysterectomy     In [place] Dec 2021    Social History:  reports that she has never smoked. She has never used smokeless tobacco. She reports that she does not drink alcohol and does not use drugs.  Allergies: No Known Allergies  Family History:  Family History  Problem Relation Age of Onset   Endometriosis Sister    Fibroids Sister    Breast cancer Neg Hx      Current Outpatient Medications:    Cholecalciferol (VITAMIN D3) 50 MCG (2000 UT) capsule, Take 2,000 Units by mouth daily., Disp: , Rfl:   Review of Systems:  Negative unless indicated in HPI.   Physical Exam: Vitals:   11/07/22 0706  BP: 110/68  Pulse: 70  Temp: 98.2 F (36.8 C)  TempSrc: Oral  SpO2: 98%  Weight: 153 lb (69.4 kg)  Height: 5' 3.5" (1.613 m)    Body mass index is 26.68 kg/m.   Physical Exam Vitals reviewed.  Constitutional:      General: She is not in acute distress.    Appearance: Normal appearance. She is not ill-appearing, toxic-appearing or diaphoretic.  HENT:     Head: Normocephalic.     Right Ear: Tympanic membrane, ear canal and external ear normal. There is no impacted cerumen.     Left Ear: Tympanic membrane, ear canal and external ear normal. There is no impacted cerumen.     Nose: Nose normal.     Mouth/Throat:     Mouth: Mucous membranes are  moist.     Pharynx: Oropharynx is clear. No oropharyngeal exudate or posterior oropharyngeal erythema.  Eyes:     General: No scleral icterus.       Right eye: No discharge.        Left eye: No discharge.     Conjunctiva/sclera: Conjunctivae normal.     Pupils: Pupils are equal, round, and reactive to light.  Neck:     Vascular: No carotid bruit.  Cardiovascular:     Rate and Rhythm: Normal rate and regular rhythm.     Pulses: Normal pulses.     Heart sounds: Normal heart sounds.  Pulmonary:     Effort: Pulmonary effort is normal. No respiratory distress.     Breath sounds: Normal breath sounds.  Abdominal:     General: Abdomen is flat. Bowel sounds are normal.     Palpations: Abdomen is soft.  Musculoskeletal:        General: Normal range of motion.     Cervical back: Normal range of motion.  Skin:    General: Skin is warm and dry.  Neurological:     General: No focal deficit present.     Mental Status: She is alert and oriented to person, place, and time. Mental status is at baseline.  Psychiatric:  Mood and Affect: Mood normal.        Behavior: Behavior normal.        Thought Content: Thought content normal.        Judgment: Judgment normal.     Flowsheet Row Office Visit from 11/07/2022 in [facility] [place]  PHQ-9 Total Score 0        Impression and Plan:  Encounter for preventive health examination  IGT (impaired glucose tolerance) -     CBC with Differential/Platelet; Future -     Comprehensive metabolic panel; Future -     Lipid panel; Future -     Hemoglobin A1c  Vitamin D deficiency -     VITAMIN D 25 Hydroxy (Vit-D Deficiency, Fractures); Future  Gastroesophageal reflux disease without esophagitis  Encounter for hepatitis C screening test for low risk patient -     Hepatitis C antibody; Future   -Recommend routine eye and dental care. -Healthy lifestyle discussed in detail. -Labs to be updated today. -Prostate  cancer screening: N/A Health Maintenance  Topic Date Due   Hepatitis C Screening  Never done   COVID-19 Vaccine (3 - 2023-24 season) 11/23/2022*   Flu Shot  12/06/2022   Pap Smear  10/05/2024   DTaP/Tdap/Td vaccine (2 - Td or Tdap) 01/07/2029   HIV Screening  Completed   HPV Vaccine  Aged Out  *Topic was postponed. The date shown is not the original due date.        Chaya Jan, MD [facility]

## 2022-11-08 LAB — HEPATITIS C ANTIBODY: Hepatitis C Ab: NONREACTIVE

## 2022-11-13 ENCOUNTER — Other Ambulatory Visit: Payer: Self-pay | Admitting: Internal Medicine

## 2022-11-13 DIAGNOSIS — E559 Vitamin D deficiency, unspecified: Secondary | ICD-10-CM

## 2022-11-13 MED ORDER — VITAMIN D (ERGOCALCIFEROL) 1.25 MG (50000 UNIT) PO CAPS: 50000.0000 [IU] | ORAL_CAPSULE | ORAL | 0 refills | Status: AC

## 2022-12-13 IMAGING — MG MM DIGITAL DIAGNOSTIC UNILAT*L* W/ TOMO W/ CAD
6 of 10 series · 6 of 30 positions shown · non-contrast
Comparison: Previous exam(s).

CLINICAL DATA: Patient recalled from screening for left breast
asymmetry.

EXAM:
DIGITAL DIAGNOSTIC UNILATERAL LEFT MAMMOGRAM WITH TOMOSYNTHESIS AND
CAD; ULTRASOUND LEFT BREAST LIMITED
TECHNIQUE: Left digital diagnostic mammography and breast tomosynthesis was
performed. The images were evaluated with computer-aided detection.;
Targeted ultrasound examination of the left breast was performed

[L XCCL synth-2D]
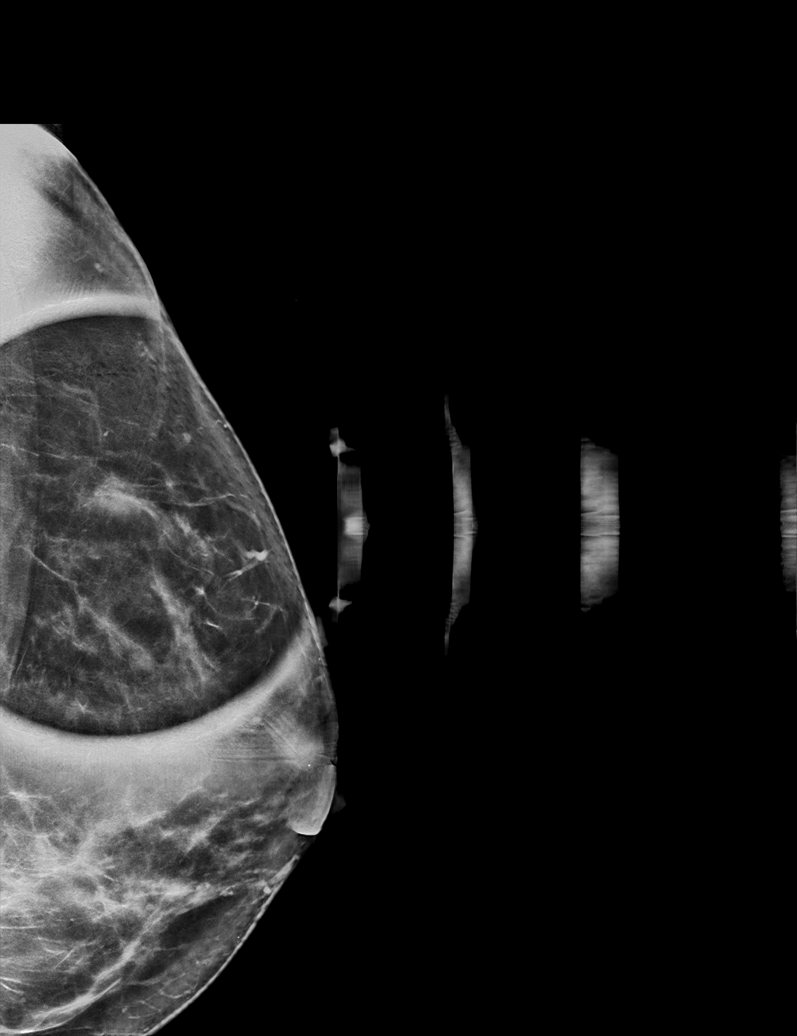

[L MLO synth-2D]
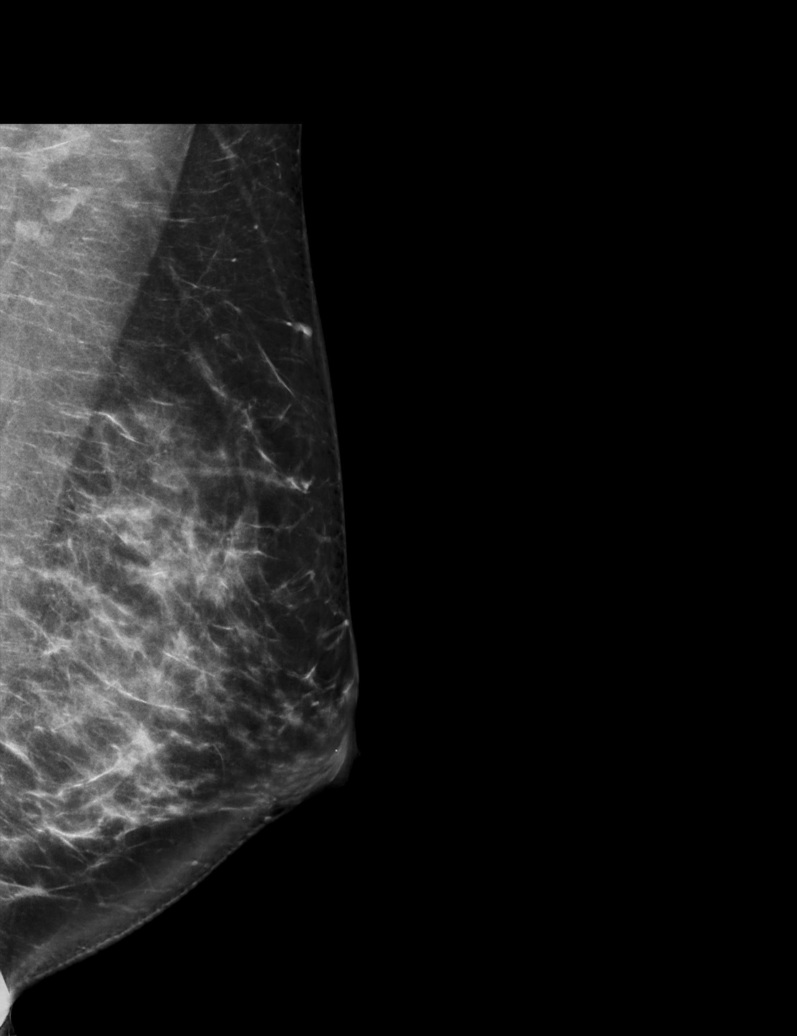

[L ML synth-2D]
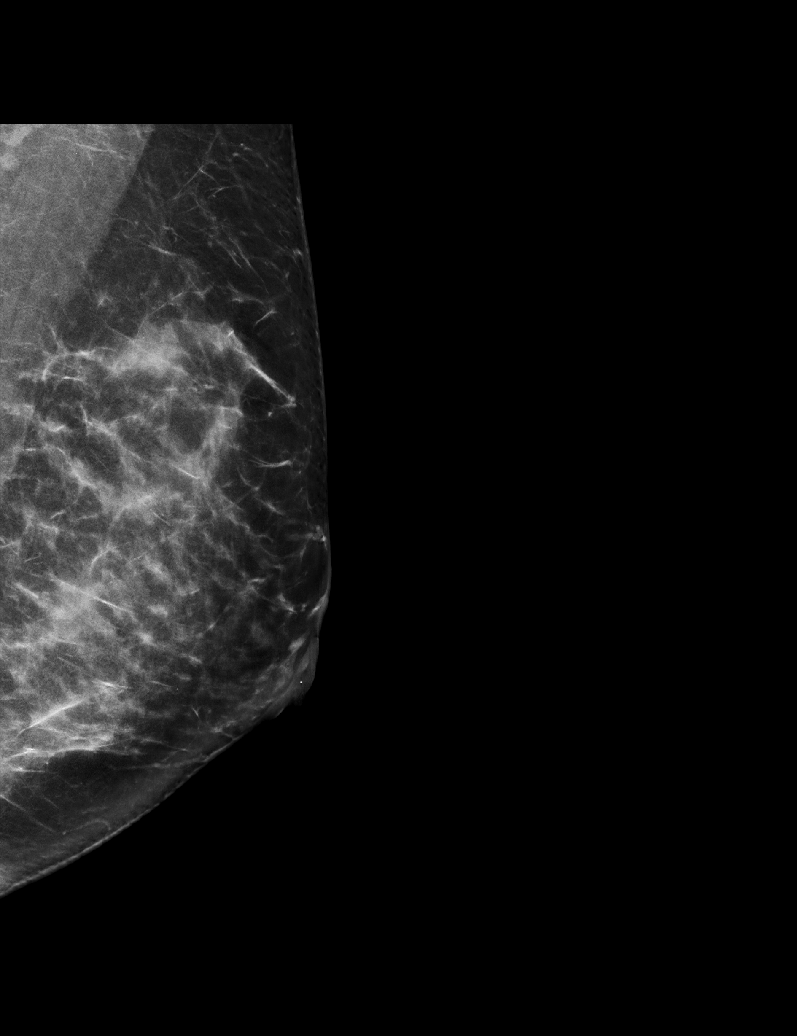

[L CC synth-2D (1 of 2)]
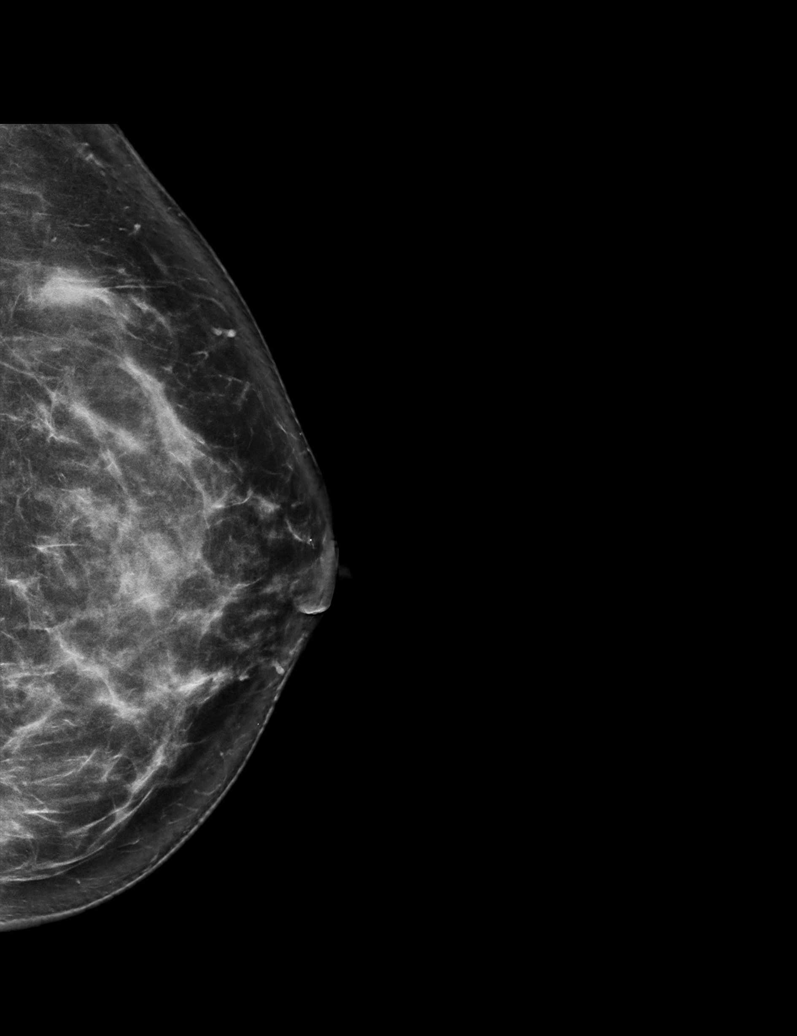

[L CC synth-2D (2 of 2)]
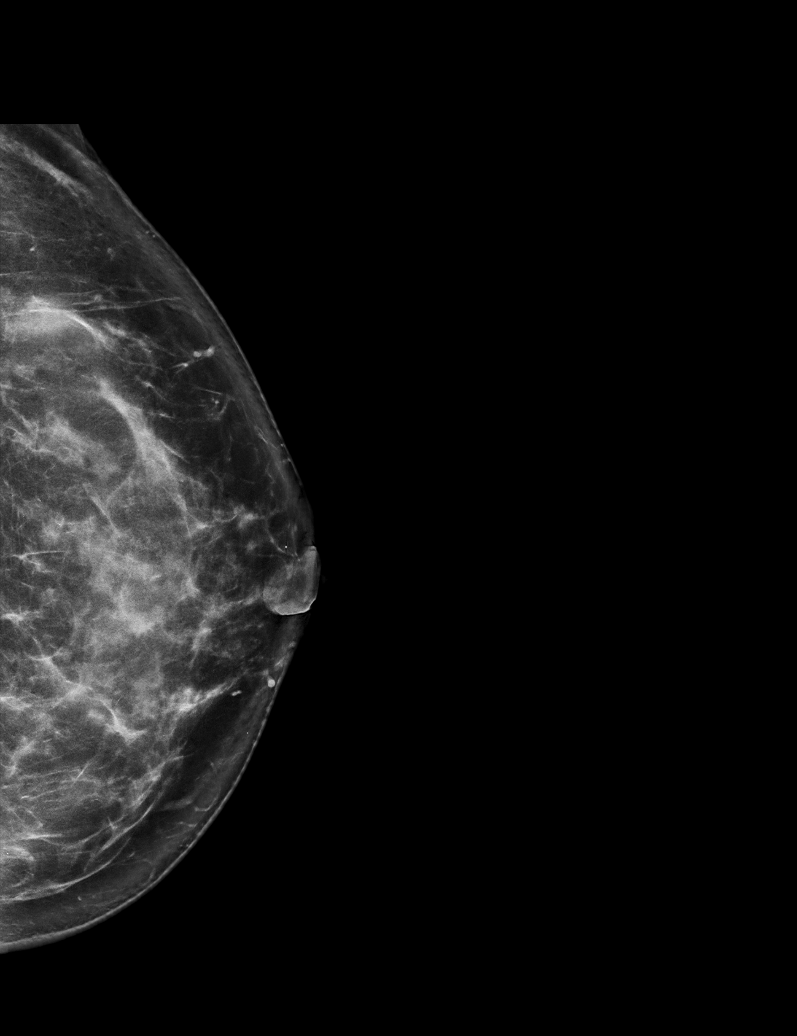

[L CC tomo · tomo slice 40/79.0]
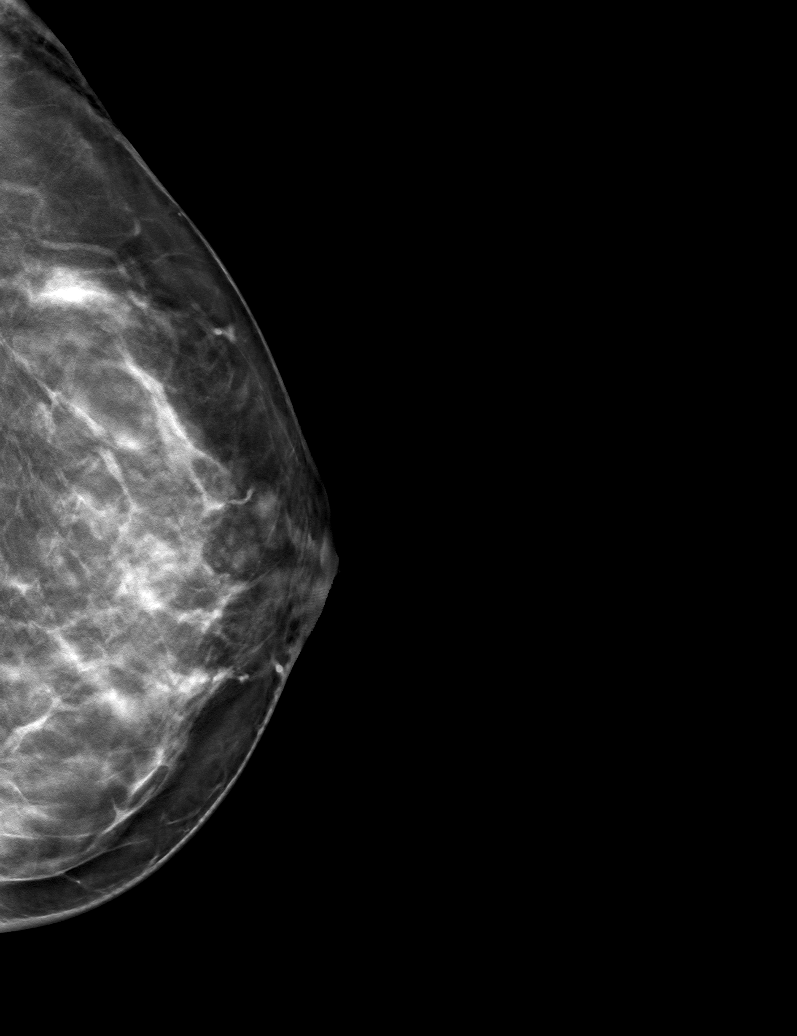

[6 of 30 positions shown; findings below may reference images not displayed]

ACR Breast Density Category c: The breast tissue is heterogeneously
dense, which may obscure small masses.
FINDINGS: There is a persistent asymmetry within the outer aspect of the left
breast. Overall this is unchanged when compared to prior exams.

On physical exam, no discrete mass palpated outer left breast.

Targeted ultrasound is performed, showing normal dense tissue
without suspicious mass within the outer left breast.
IMPRESSION: Left breast asymmetry favored to represent dense fibroglandular
tissue. No mammographic evidence for malignancy.

RECOMMENDATION:
Screening mammogram in one year.(Code:S3-F-HV4)

I have discussed the findings and recommendations with the patient.
If applicable, a reminder letter will be sent to the patient
regarding the next appointment.

BI-RADS CATEGORY  2: Benign.

## 2023-02-03 ENCOUNTER — Other Ambulatory Visit: Payer: Self-pay | Admitting: Internal Medicine

## 2023-02-03 DIAGNOSIS — E559 Vitamin D deficiency, unspecified: Secondary | ICD-10-CM
